# Patient Record
Sex: Female | Born: 1975 | Race: White | Hispanic: No | Marital: Married | Smoking: Never smoker
Health system: Southern US, Community
[De-identification: ages and names within clinical notes are randomized; demographics above are authoritative.]

## PROBLEM LIST (undated history)

## (undated) DIAGNOSIS — E039 Hypothyroidism, unspecified: Secondary | ICD-10-CM

## (undated) DIAGNOSIS — E7800 Pure hypercholesterolemia, unspecified: Secondary | ICD-10-CM

## (undated) DIAGNOSIS — K760 Fatty (change of) liver, not elsewhere classified: Secondary | ICD-10-CM

## (undated) DIAGNOSIS — E669 Obesity, unspecified: Secondary | ICD-10-CM

## (undated) DIAGNOSIS — G629 Polyneuropathy, unspecified: Secondary | ICD-10-CM

## (undated) DIAGNOSIS — I10 Essential (primary) hypertension: Secondary | ICD-10-CM

## (undated) DIAGNOSIS — R51 Headache: Secondary | ICD-10-CM

## (undated) DIAGNOSIS — M109 Gout, unspecified: Secondary | ICD-10-CM

## (undated) DIAGNOSIS — R519 Headache, unspecified: Secondary | ICD-10-CM

## (undated) HISTORY — DX: Pure hypercholesterolemia, unspecified: E78.00

## (undated) HISTORY — DX: Polyneuropathy, unspecified: G62.9

## (undated) HISTORY — DX: Hypothyroidism, unspecified: E03.9

## (undated) HISTORY — DX: Obesity, unspecified: E66.9

## (undated) HISTORY — DX: Essential (primary) hypertension: I10

## (undated) HISTORY — DX: Gout, unspecified: M10.9

## (undated) HISTORY — DX: Headache: R51

## (undated) HISTORY — DX: Fatty (change of) liver, not elsewhere classified: K76.0

## (undated) HISTORY — DX: Headache, unspecified: R51.9

---

## 2006-10-29 ENCOUNTER — Ambulatory Visit: Payer: Self-pay | Admitting: Internal Medicine

## 2006-10-29 LAB — CONVERTED CEMR LAB
BUN: 12 mg/dL (ref 6–23)
Free T4: 1.7 ng/dL (ref 0.9–1.8)
GFR calc non Af Amer: 103 mL/min
Glomerular Filtration Rate, Af Am: 125 mL/min/{1.73_m2}
MCHC: 33.7 g/dL (ref 30.0–36.0)
MCV: 84.5 fL (ref 78.0–100.0)
Potassium: 3.5 meq/L (ref 3.5–5.1)
RBC: 4.84 M/uL (ref 3.87–5.11)
RDW: 12.1 % (ref 11.5–14.6)
Sodium: 137 meq/L (ref 135–145)
T3, Free: 3.7 pg/mL (ref 2.3–4.2)
WBC: 11.2 10*3/uL — ABNORMAL HIGH (ref 4.5–10.5)

## 2008-06-02 ENCOUNTER — Ambulatory Visit: Payer: Self-pay | Admitting: Internal Medicine

## 2008-06-02 DIAGNOSIS — R002 Palpitations: Secondary | ICD-10-CM | POA: Insufficient documentation

## 2008-06-02 DIAGNOSIS — I10 Essential (primary) hypertension: Secondary | ICD-10-CM | POA: Insufficient documentation

## 2008-06-02 DIAGNOSIS — E039 Hypothyroidism, unspecified: Secondary | ICD-10-CM | POA: Insufficient documentation

## 2008-06-03 ENCOUNTER — Telehealth (INDEPENDENT_AMBULATORY_CARE_PROVIDER_SITE_OTHER): Payer: Self-pay | Admitting: *Deleted

## 2008-06-03 ENCOUNTER — Ambulatory Visit: Payer: Self-pay | Admitting: Internal Medicine

## 2008-06-03 LAB — CONVERTED CEMR LAB
Basophils Absolute: 0.1 10*3/uL (ref 0.0–0.1)
Basophils Relative: 0.6 % (ref 0.0–3.0)
Chloride: 106 meq/L (ref 96–112)
Creatinine, Ser: 0.8 mg/dL (ref 0.4–1.2)
Eosinophils Absolute: 0.3 10*3/uL (ref 0.0–0.7)
GFR calc non Af Amer: 87 mL/min
Lymphocytes Relative: 33.6 % (ref 12.0–46.0)
MCHC: 34.6 g/dL (ref 30.0–36.0)
MCV: 86.8 fL (ref 78.0–100.0)
Neutrophils Relative %: 58.1 % (ref 43.0–77.0)
Platelets: 245 10*3/uL (ref 150–400)
RBC: 4.4 M/uL (ref 3.87–5.11)
RDW: 12 % (ref 11.5–14.6)
Sodium: 139 meq/L (ref 135–145)
TSH: 0.74 microintl units/mL (ref 0.35–5.50)

## 2011-11-21 DIAGNOSIS — K760 Fatty (change of) liver, not elsewhere classified: Secondary | ICD-10-CM

## 2011-11-21 HISTORY — DX: Fatty (change of) liver, not elsewhere classified: K76.0

## 2014-03-03 ENCOUNTER — Ambulatory Visit (INDEPENDENT_AMBULATORY_CARE_PROVIDER_SITE_OTHER): Payer: BC Managed Care – PPO | Admitting: Diagnostic Neuroimaging

## 2014-03-03 ENCOUNTER — Encounter (INDEPENDENT_AMBULATORY_CARE_PROVIDER_SITE_OTHER): Payer: Self-pay

## 2014-03-03 ENCOUNTER — Encounter: Payer: Self-pay | Admitting: Diagnostic Neuroimaging

## 2014-03-03 VITALS — BP 159/102 | HR 90 | Temp 98.1°F | Ht 64.0 in | Wt 220.0 lb

## 2014-03-03 DIAGNOSIS — G609 Hereditary and idiopathic neuropathy, unspecified: Secondary | ICD-10-CM

## 2014-03-03 DIAGNOSIS — R200 Anesthesia of skin: Secondary | ICD-10-CM

## 2014-03-03 DIAGNOSIS — R209 Unspecified disturbances of skin sensation: Secondary | ICD-10-CM

## 2014-03-03 MED ORDER — GABAPENTIN 300 MG PO CAPS: 300.0000 mg | ORAL_CAPSULE | Freq: Three times a day (TID) | ORAL | Status: DC

## 2014-03-03 NOTE — Patient Instructions (Signed)
Gabapentin capsules or tablets What is this medicine? GABAPENTIN (GA ba pen tin) is used to control partial seizures in adults with epilepsy. It is also used to treat certain types of nerve pain. This medicine may be used for other purposes; ask your health care provider or pharmacist if you have questions. COMMON BRAND NAME(S): Ascencion DikeGabarone , Neurontin  What should I tell my health care provider before I take this medicine? -pregnant or trying to get pregnant -breast-feeding  How should I use this medicine? Take this medicine by mouth with a glass of water. Follow the directions on the prescription label. You can take it with or without food. If it upsets your stomach, take it with food.Take your medicine at regular intervals. Do not take it more often than directed. Do not stop taking except on your doctor's advice.Overdosage: If you think you have taken too much of this medicine contact a poison control center or emergency room at once. NOTE: This medicine is only for you. Do not share this medicine with others.  What if I miss a dose? If you miss a dose, take it as soon as you can. If it is almost time for your next dose, take only that dose. Do not take double or extra doses.  What may interact with this medicine? Do not take this medicine with any of the following medications: -other gabapentin products  This medicine may also interact with the following medications: -alcohol -antacids -antihistamines for allergy, cough and cold -certain medicines for anxiety or sleep -certain medicines for depression or psychotic disturbances -homatropine; hydrocodone -naproxen -narcotic medicines (opiates) for pain -phenothiazines like chlorpromazine, mesoridazine, prochlorperazine, thioridazine This list may not describe all possible interactions. Give your health care provider a list of all the medicines, herbs, non-prescription drugs, or dietary supplements you use. Also tell them if you smoke,  drink alcohol, or use illegal drugs. Some items may interact with your medicine.  What should I watch for while using this medicine? Visit your doctor or health care professional for regular checks on your progress. You may want to keep a record at home of how you feel your condition is responding to treatment. You may want to share this information with your doctor or health care professional at each visit. You should contact your doctor or health care professional if your seizures get worse or if you have any new types of seizures. Do not stop taking this medicine or any of your seizure medicines unless instructed by your doctor or health care professional. Stopping your medicine suddenly can increase your seizures or their severity. Wear a medical identification bracelet or chain if you are taking this medicine for seizures, and carry a card that lists all your medications. You may get drowsy, dizzy, or have blurred vision. Do not drive, use machinery, or do anything that needs mental alertness until you know how this medicine affects you. To reduce dizzy or fainting spells, do not sit or stand up quickly, especially if you are an older patient. Alcohol can increase drowsiness and dizziness. Avoid alcoholic drinks. Your mouth may get dry. Chewing sugarless gum or sucking hard candy, and drinking plenty of water will help. The use of this medicine may increase the chance of suicidal thoughts or actions. Pay special attention to how you are responding while on this medicine. Any worsening of mood, or thoughts of suicide or dying should be reported to your health care professional right away. Women who become pregnant while using this medicine may  enroll in the [place] American Antiepileptic Drug Pregnancy Registry by calling [phone]. This registry collects information about the safety of antiepileptic drug use during pregnancy.  What side effects may I notice from receiving this medicine? Side  effects that you should report to your doctor or health care professional as soon as possible: -allergic reactions like skin rash, itching or hives, swelling of the face, lips, or tongue -worsening of mood, thoughts or actions of suicide or dying Side effects that usually do not require medical attention (report to your doctor or health care professional if they continue or are bothersome): -constipation -difficulty walking or controlling muscle movements -dizziness -nausea -slurred speech -tiredness -tremors -weight gain This list may not describe all possible side effects. Call your doctor for medical advice about side effects. You may report side effects to FDA at 1-800-FDA-1088. Where should I keep my medicine? Keep out of reach of children. Store at room temperature between 15 and 30 degrees C (59 and 86 degrees F). Throw away any unused medicine after the expiration date. NOTE: This sheet is a summary. It may not cover all possible information. If you have questions about this medicine, talk to your doctor, pharmacist, or health care provider.  2014, Elsevier/Gold Standard. (2013-07-10 09:12:48)

## 2014-03-03 NOTE — Progress Notes (Signed)
[facility]  PATIENT: Tricia Velazquez DOB: 11/20/1975  REFERRING CLINICIAN: Andee Poles Webb HISTORY FROM: patient  REASON FOR VISIT: new consult   HISTORICAL  CHIEF COMPLAINT:  Chief Complaint  Patient presents with  . Numbness    feet, bilateral  . Pain    toes    HISTORY OF PRESENT ILLNESS:   38 year old right-handed female with hypothyroidism, hypertension, here for evaluation of possible neuropathy.  For past 5-6 years patient has had gradual onset, slowly progressive numbness in her toes bilaterally. Now symptoms are worsening. She feels pins and needle sensations in her his toes. Symptoms are worse when she is wearing socks or shoes. Symptoms are worse in the evening. Sometimes the pain shoots and stabs in the feet, causing her to jerk her legs or wake up from sleep.  Occasional numbness in fingers, but not consistent. Patient denies any neck pain, low back pain, radicular pain. No bladder or bowel incontinence. No weakness. No balance or gait difficulty. No headaches, vision changes, slurred speech or trouble swallowing.   REVIEW OF SYSTEMS: Full 14 system review of systems performed and notable only for only as per history of present illness. Otherwise negative.  ALLERGIES: Not on File  HOME MEDICATIONS: No outpatient prescriptions prior to visit.   No facility-administered medications prior to visit.    PAST MEDICAL HISTORY: Past Medical History  Diagnosis Date  . Fatty liver 2013  . Hypertension   . Obesity   . Hypercholesteremia   . Hypothyroidism     PAST SURGICAL HISTORY: Past Surgical History  Procedure Laterality Date  . Cesarean section  1999    FAMILY HISTORY: Family History  Problem Relation Age of Onset  . Diabetes Father   . Heart attack Father   . Heart Problems Maternal Grandfather   . Lung cancer Paternal Grandmother   . Lung cancer Paternal Grandfather   . Heart Problems Paternal Grandfather     SOCIAL  HISTORY:  History   Social History  . Marital Status: Married    Spouse Name: Jethro BastosWilliam Mark Dena    Number of Children: 1  . Years of Education: 12th   Occupational History  .  Other    [employer]   Social History Main Topics  . Smoking status: Never Smoker   . Smokeless tobacco: Never Used  . Alcohol Use: Yes     Comment: occasionally/ very rare  . Drug Use: No  . Sexual Activity: Not on file   Other Topics Concern  . Not on file   Social History Narrative   Patient lives at home with family.   Caffeine Use: 2 cups of soda daily     PHYSICAL EXAM  Filed Vitals:   03/03/14 0956  BP: 159/102  Pulse: 90  Temp: 98.1 F (36.7 C)  TempSrc: Oral  Height: 5\' 4"  (1.626 m)  Weight: 220 lb (99.791 kg)    Not recorded    Body mass index is 37.74 kg/(m^2).  GENERAL EXAM: Patient is in no distress; well developed, nourished and groomed; neck is supple  CARDIOVASCULAR: Regular rate and rhythm, no murmurs, no carotid bruits  NEUROLOGIC: MENTAL STATUS: awake, alert, oriented to person, place and time, recent and remote memory intact, normal attention and concentration, language fluent, comprehension intact, naming intact, fund of knowledge appropriate CRANIAL NERVE: no papilledema on fundoscopic exam, pupils equal and reactive to light, visual fields full to confrontation, extraocular muscles intact, no nystagmus, facial sensation and strength symmetric, hearing  intact, palate elevates symmetrically, uvula midline, shoulder shrug symmetric, tongue midline. MOTOR: normal bulk and tone, full strength in the BUE, BLE SENSORY: normal and symmetric to light touch, temperature, vibration and proprioception; SLIGHTLY DECR PP IN DISTAL FEET BILATERALLY. COORDINATION: finger-nose-finger, fine finger movements normal REFLEXES: deep tendon reflexes present and symmetric; ANKLES 1. DOWN GOING TOES. GAIT/STATION: narrow based gait; able to walk on toes, heels and tandem;  romberg is negative    DIAGNOSTIC DATA (LABS, IMAGING, TESTING) - I reviewed patient records, labs, notes, testing and imaging myself where available.  Lab Results  Component Value Date   WBC 9.7 06/03/2008   HGB 13.2 06/03/2008   HCT 38.2 06/03/2008   MCV 86.8 06/03/2008   PLT 245 06/03/2008      Component Value Date/Time   NA 139 06/03/2008 1301   K 3.5 06/03/2008 1301   CL 106 06/03/2008 1301   CO2 28 06/03/2008 1301   GLUCOSE 108* 06/03/2008 1301   GLUCOSE 87 10/29/2006 1346   BUN 7 06/03/2008 1301   CREATININE 0.8 06/03/2008 1301   CALCIUM 9.0 06/03/2008 1301   GFRNONAA 87 06/03/2008 1301   GFRAA 106 06/03/2008 1301   No results found for this basename: CHOL, HDL, LDLCALC, LDLDIRECT, TRIG, CHOLHDL   No results found for this basename: HGBA1C   No results found for this basename: VITAMINB12   Lab Results  Component Value Date   TSH 0.74 06/03/2008    LABS (02/17/14) B12 512 FOLATE 15.4 TSH 0.48 GLUCOSE 86   ASSESSMENT AND PLAN  38 y.o. year old female here with numbness and tingling in the toes for past 5-6 years. 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 We'll pursue further workup and treat symptomatically with gabapentin.  PLAN: - neuropathy panel - EMG/NCS - gabapentin   Orders Placed This Encounter  Procedures  . Neuropathy Panel  . NCV with EMG(electromyography)   Meds ordered this encounter  Medications  . gabapentin (NEURONTIN) 300 MG capsule    Sig: Take 1 capsule (300 mg total) by mouth 3 (three) times daily.    Dispense:  90 capsule    Refill:  11   Return for EMG/NCS.    Suanne MarkerVIKRAM R. Savina Olshefski, MD 03/03/2014, 11:19 AM Certified in Neurology, Neurophysiology and Neuroimaging  [facility] [address], [address] [place], [place] [address] [phone]

## 2014-03-05 LAB — NEUROPATHY PANEL
A/G Ratio: 1.2 (ref 0.7–2.0)
ALPHA 1: 0.2 g/dL (ref 0.1–0.4)
ALPHA 2: 0.8 g/dL (ref 0.4–1.2)
ANA: NEGATIVE
Albumin ELP: 4 g/dL (ref 3.2–5.6)
Angio Convert Enzyme: 43 U/L (ref 14–82)
BETA: 1.1 g/dL (ref 0.6–1.3)
GLOBULIN, TOTAL: 3.3 g/dL (ref 2.0–4.5)
Gamma Globulin: 1.3 g/dL (ref 0.5–1.6)
Rhuematoid fact SerPl-aCnc: 7.4 IU/mL (ref 0.0–13.9)
Sed Rate: 6 mm/hr (ref 0–32)
TOTAL PROTEIN: 7.3 g/dL (ref 6.0–8.5)
TSH: 0.251 u[IU]/mL — ABNORMAL LOW (ref 0.450–4.500)
VIT D 25 HYDROXY: 22.4 ng/mL — AB (ref 30.0–100.0)
Vitamin B-12: 553 pg/mL (ref 211–946)

## 2014-03-09 ENCOUNTER — Encounter (INDEPENDENT_AMBULATORY_CARE_PROVIDER_SITE_OTHER): Payer: Self-pay

## 2014-03-09 ENCOUNTER — Ambulatory Visit (INDEPENDENT_AMBULATORY_CARE_PROVIDER_SITE_OTHER): Payer: BC Managed Care – PPO | Admitting: Diagnostic Neuroimaging

## 2014-03-09 DIAGNOSIS — R200 Anesthesia of skin: Secondary | ICD-10-CM

## 2014-03-09 DIAGNOSIS — R209 Unspecified disturbances of skin sensation: Secondary | ICD-10-CM

## 2014-03-09 DIAGNOSIS — G609 Hereditary and idiopathic neuropathy, unspecified: Secondary | ICD-10-CM

## 2014-03-09 DIAGNOSIS — Z0289 Encounter for other administrative examinations: Secondary | ICD-10-CM

## 2014-03-09 NOTE — Procedures (Signed)
   [facility]  NCS (NERVE CONDUCTION STUDY) WITH EMG (ELECTROMYOGRAPHY) REPORT   STUDY DATE: 03/09/14 PATIENT NAME: Tricia Velazquez DOB: 05/10/1976 MRN: [id number]  ORDERING CLINICIAN: Joycelyn SchmidVikram Ela Moffat, MD   TECHNOLOGIST: Gearldine ShownLorraine Jones ELECTROMYOGRAPHER: Glenford BayleyVikram R. Abrea Henle, MD  CLINICAL INFORMATION: 38 year old female with bilateral foot numbness and pain.  FINDINGS: NERVE CONDUCTION STUDY: Right peroneal and bilateral tibial motor responses and F-wave latencies are normal. Left peroneal motor response has decreased amplitude and normal conduction velocity. Left peroneal F wave latency is normal. Bilateral H reflex responses are normal. Bilateral peroneal sensory responses are normal. In  NEEDLE ELECTROMYOGRAPHY: Needle exam of lower extremities and lumbar paraspinal muscles is normal. Right vastus medialis, right tibialis anterior, right gastrocnemius, left tibialis anterior, right L4-5 and right L5-S1 paraspinal muscles were assessed.  IMPRESSION:  Mildly abnormal study demonstrate: 1. Mild left peroneal motor response amplitude reduction. Normal nerve conduction studies otherwise. Normal needle EMG. May represent mild left L5 radiculopathy versus mild left peroneal motor neuropathy. 2. No underlying widespread large fiber neuropathy.   INTERPRETING PHYSICIAN:  Tricia MarkerVIKRAM R. Ryane Konieczny, MD Certified in Neurology, Neurophysiology and Neuroimaging  [facility] [address], [address] [place], [place] [address] [phone]

## 2014-11-29 ENCOUNTER — Emergency Department (HOSPITAL_BASED_OUTPATIENT_CLINIC_OR_DEPARTMENT_OTHER)
Admission: EM | Admit: 2014-11-29 | Discharge: 2014-11-29 | Disposition: A | Discharge status: Home / Self Care | Payer: BLUE CROSS/BLUE SHIELD | Attending: Emergency Medicine | Admitting: Emergency Medicine

## 2014-11-29 ENCOUNTER — Emergency Department (HOSPITAL_BASED_OUTPATIENT_CLINIC_OR_DEPARTMENT_OTHER): Payer: BLUE CROSS/BLUE SHIELD

## 2014-11-29 ENCOUNTER — Encounter (HOSPITAL_BASED_OUTPATIENT_CLINIC_OR_DEPARTMENT_OTHER): Payer: Self-pay | Admitting: *Deleted

## 2014-11-29 DIAGNOSIS — Z79899 Other long term (current) drug therapy: Secondary | ICD-10-CM | POA: Diagnosis not present

## 2014-11-29 DIAGNOSIS — J189 Pneumonia, unspecified organism: Secondary | ICD-10-CM

## 2014-11-29 DIAGNOSIS — R000 Tachycardia, unspecified: Secondary | ICD-10-CM | POA: Diagnosis not present

## 2014-11-29 DIAGNOSIS — J159 Unspecified bacterial pneumonia: Secondary | ICD-10-CM | POA: Diagnosis not present

## 2014-11-29 DIAGNOSIS — Z8719 Personal history of other diseases of the digestive system: Secondary | ICD-10-CM | POA: Insufficient documentation

## 2014-11-29 DIAGNOSIS — I10 Essential (primary) hypertension: Secondary | ICD-10-CM | POA: Diagnosis not present

## 2014-11-29 DIAGNOSIS — R05 Cough: Secondary | ICD-10-CM

## 2014-11-29 DIAGNOSIS — E039 Hypothyroidism, unspecified: Secondary | ICD-10-CM | POA: Diagnosis not present

## 2014-11-29 DIAGNOSIS — E669 Obesity, unspecified: Secondary | ICD-10-CM | POA: Diagnosis not present

## 2014-11-29 DIAGNOSIS — R059 Cough, unspecified: Secondary | ICD-10-CM

## 2014-11-29 MED ORDER — LIDOCAINE HCL (PF) 1 % IJ SOLN
INTRAMUSCULAR | Status: AC
  Administered 2014-11-29: 2.1 mL
  Filled 2014-11-29: qty 5

## 2014-11-29 MED ORDER — AZITHROMYCIN 250 MG PO TABS
500.0000 mg | ORAL_TABLET | Freq: Once | ORAL | Status: AC
  Administered 2014-11-29: 500 mg via ORAL
  Filled 2014-11-29: qty 2

## 2014-11-29 MED ORDER — BENZONATATE 100 MG PO CAPS: 100.0000 mg | ORAL_CAPSULE | Freq: Three times a day (TID) | ORAL | Status: DC

## 2014-11-29 MED ORDER — AZITHROMYCIN 250 MG PO TABS: 250.0000 mg | ORAL_TABLET | Freq: Every day | ORAL | Status: DC

## 2014-11-29 MED ORDER — CEFTRIAXONE SODIUM 1 G IJ SOLR
1.0000 g | Freq: Once | INTRAMUSCULAR | Status: AC
  Administered 2014-11-29: 1 g via INTRAMUSCULAR
  Filled 2014-11-29: qty 10

## 2014-11-29 NOTE — Discharge Instructions (Signed)
Please call your doctor for a followup appointment within 24-48 hours. When you talk to your doctor please let them know that you were seen in the emergency department and have them acquire all of your records so that they can discuss the findings with you and formulate a treatment plan to fully care for your new and ongoing problems. ° °

## 2014-11-29 NOTE — ED Notes (Signed)
Patient was seen at PCP last week for URI. Now she states she has developed N/D

## 2014-11-29 NOTE — ED Provider Notes (Signed)
CSN: [id number]     Arrival date & time 11/29/14  1752 History  This chart was scribed for Tricia RollerBrian D Flannery Cavallero, MD by Evon Slackerrance Branch, ED Scribe. This patient was seen in room MH10/MH10 and the patient's care was started at 10:11 PM.      Chief Complaint  Patient presents with  . URI   Patient is a 39 y.o. female presenting with URI. The history is provided by the patient. No language interpreter was used.  URI  HPI Comments: Tricia OgleMelissia S Velazquez is a 39 y.o. female who presents to the Emergency Department complaining of URI symptoms onset 2 days prior. She states she has gradually worsening productive cough and fever onset 2 days ago. Pt states she has also had some diarrhea as well today. Pt states she went to her PCP 2 days ago for HTN and cough and was prescribed cough medicine that hasn't provided any relief. Denies CP.   Past Medical History  Diagnosis Date  . Fatty liver 2013  . Hypertension   . Obesity   . Hypercholesteremia   . Hypothyroidism    Past Surgical History  Procedure Laterality Date  . Cesarean section  1999   Family History  Problem Relation Age of Onset  . Diabetes Father   . Heart attack Father   . Heart Problems Maternal Grandfather   . Lung cancer Paternal Grandmother   . Lung cancer Paternal Grandfather   . Heart Problems Paternal Grandfather    History  Substance Use Topics  . Smoking status: Never Smoker   . Smokeless tobacco: Never Used  . Alcohol Use: Yes     Comment: occasionally/ very rare   OB History    No data available     Review of Systems  All other systems reviewed and are negative.     Allergies  Review of patient's allergies indicates no known allergies.  Home Medications   Prior to Admission medications   Medication Sig Start Date End Date Taking? Authorizing Provider  hydrochlorothiazide (HYDRODIURIL) 12.5 MG tablet Take 12.5 mg by mouth daily.   Yes Historical Provider, MD  azithromycin (ZITHROMAX Z-PAK) 250 MG tablet Take  1 tablet (250 mg total) by mouth daily. 500mg  PO day 1, then 250mg  PO days 205 11/29/14   Tricia RollerBrian D Capers Hagmann, MD  benzonatate (TESSALON) 100 MG capsule Take 1 capsule (100 mg total) by mouth every 8 (eight) hours. 11/29/14   Tricia RollerBrian D Zaeden Lastinger, MD  gabapentin (NEURONTIN) 300 MG capsule Take 1 capsule (300 mg total) by mouth 3 (three) times daily. 03/03/14   Suanne MarkerVikram R Penumalli, MD  levothyroxine (SYNTHROID, LEVOTHROID) 175 MCG tablet Take 175 mcg by mouth daily before breakfast. 1/2 tab on Sunday    Historical Provider, MD  losartan (COZAAR) 50 MG tablet Take 50 mg by mouth daily.    Historical Provider, MD   Triage Vitals: BP 141/88 mmHg  Pulse 112  Temp(Src) 100.6 F (38.1 C) (Oral)  Resp 16  Ht 5\' 4"  (1.626 m)  Wt 225 lb (102.059 kg)  BMI 38.60 kg/m2  SpO2 93%  Physical Exam  Constitutional: She appears well-developed and well-nourished. No distress.  HENT:  Head: Normocephalic and atraumatic.  Mouth/Throat: Oropharynx is clear and moist. No oropharyngeal exudate.  Eyes: Conjunctivae and EOM are normal. Pupils are equal, round, and reactive to light. Right eye exhibits no discharge. Left eye exhibits no discharge. No scleral icterus.  Neck: Normal range of motion. Neck supple. No JVD present. No thyromegaly present.  Cardiovascular: Regular rhythm, normal heart sounds and intact distal pulses.  Tachycardia present.  Exam reveals no gallop and no friction rub.   No murmur heard. Pulmonary/Chest: Effort normal and breath sounds normal. No respiratory distress. She has no wheezes. She has no rales.  Speaks in full sentences no distress.   Abdominal: Soft. Bowel sounds are normal. She exhibits no distension and no mass. There is no tenderness.  Musculoskeletal: Normal range of motion. She exhibits no edema or tenderness.  Lymphadenopathy:    She has no cervical adenopathy.  Neurological: She is alert. Coordination normal.  Skin: Skin is warm and dry. No rash noted. No erythema.  Psychiatric: She  has a normal mood and affect. Her behavior is normal.  Nursing note and vitals reviewed.   ED Course  Procedures (including critical care time) DIAGNOSTIC STUDIES: Oxygen Saturation is 93% on RA, adequate by my interpretation.    COORDINATION OF CARE: 10:22 PM-Discussed treatment plan with pt at bedside and pt agreed to plan.     Labs Review Labs Reviewed - No data to display  Imaging Review Dg Chest 2 View  11/29/2014   CLINICAL DATA:  Cough and congestion.  Nausea.  EXAM: CHEST  2 VIEW  COMPARISON:  None.  FINDINGS: Abnormal airspace opacity in the left mid lung is difficult to correlate on the lateral projection but probably in the lingula. The lungs appear otherwise clear. Cardiac and mediastinal margins appear normal.  IMPRESSION: 1. Lingular airspace opacity favoring pneumonia.   Electronically Signed   By: Herbie Baltimore M.D.   On: 11/29/2014 19:28     MDM   Final diagnoses:  CAP (community acquired pneumonia)   The patient is not in respiratory distress, she has a low-grade fever with a mild tachycardia and normal oxygenation of 98% on room air with no hypotension and no abnormal lung sounds on exam.  I have discussed with the patient the treatment course including antibiotics, Rocephin intramuscular, Zithromax oral, she agrees to follow-up very closely and return should her symptoms worsen. She does appear to have a community-acquired pneumonia. I have personally seen and interpreted the TPA and lateral views of the chest radiographs, this is consistent with a left lung pneumonia. The patient was informed of the results, she is agreeable to the plan.   Meds given in ED:  Medications  cefTRIAXone (ROCEPHIN) injection 1 g (not administered)  azithromycin (ZITHROMAX) tablet 500 mg (not administered)    New Prescriptions   AZITHROMYCIN (ZITHROMAX Z-PAK) 250 MG TABLET    Take 1 tablet (250 mg total) by mouth daily.  PO day 1, then  PO days 205   BENZONATATE  (TESSALON) 100 MG CAPSULE    Take 1 capsule (100 mg total) by mouth every 8 (eight) hours.      I personally performed the services described in this documentation, which was scribed in my presence. The recorded information has been reviewed and is accurate.      Tricia Roller, MD 11/29/14 [phone]

## 2015-06-19 IMAGING — CR DG CHEST 2V
2 series · 2 of 2 positions shown · non-contrast
Comparison: None.

CLINICAL DATA: Cough and congestion.  Nausea.

EXAM:
CHEST  2 VIEW

[w chest pa]
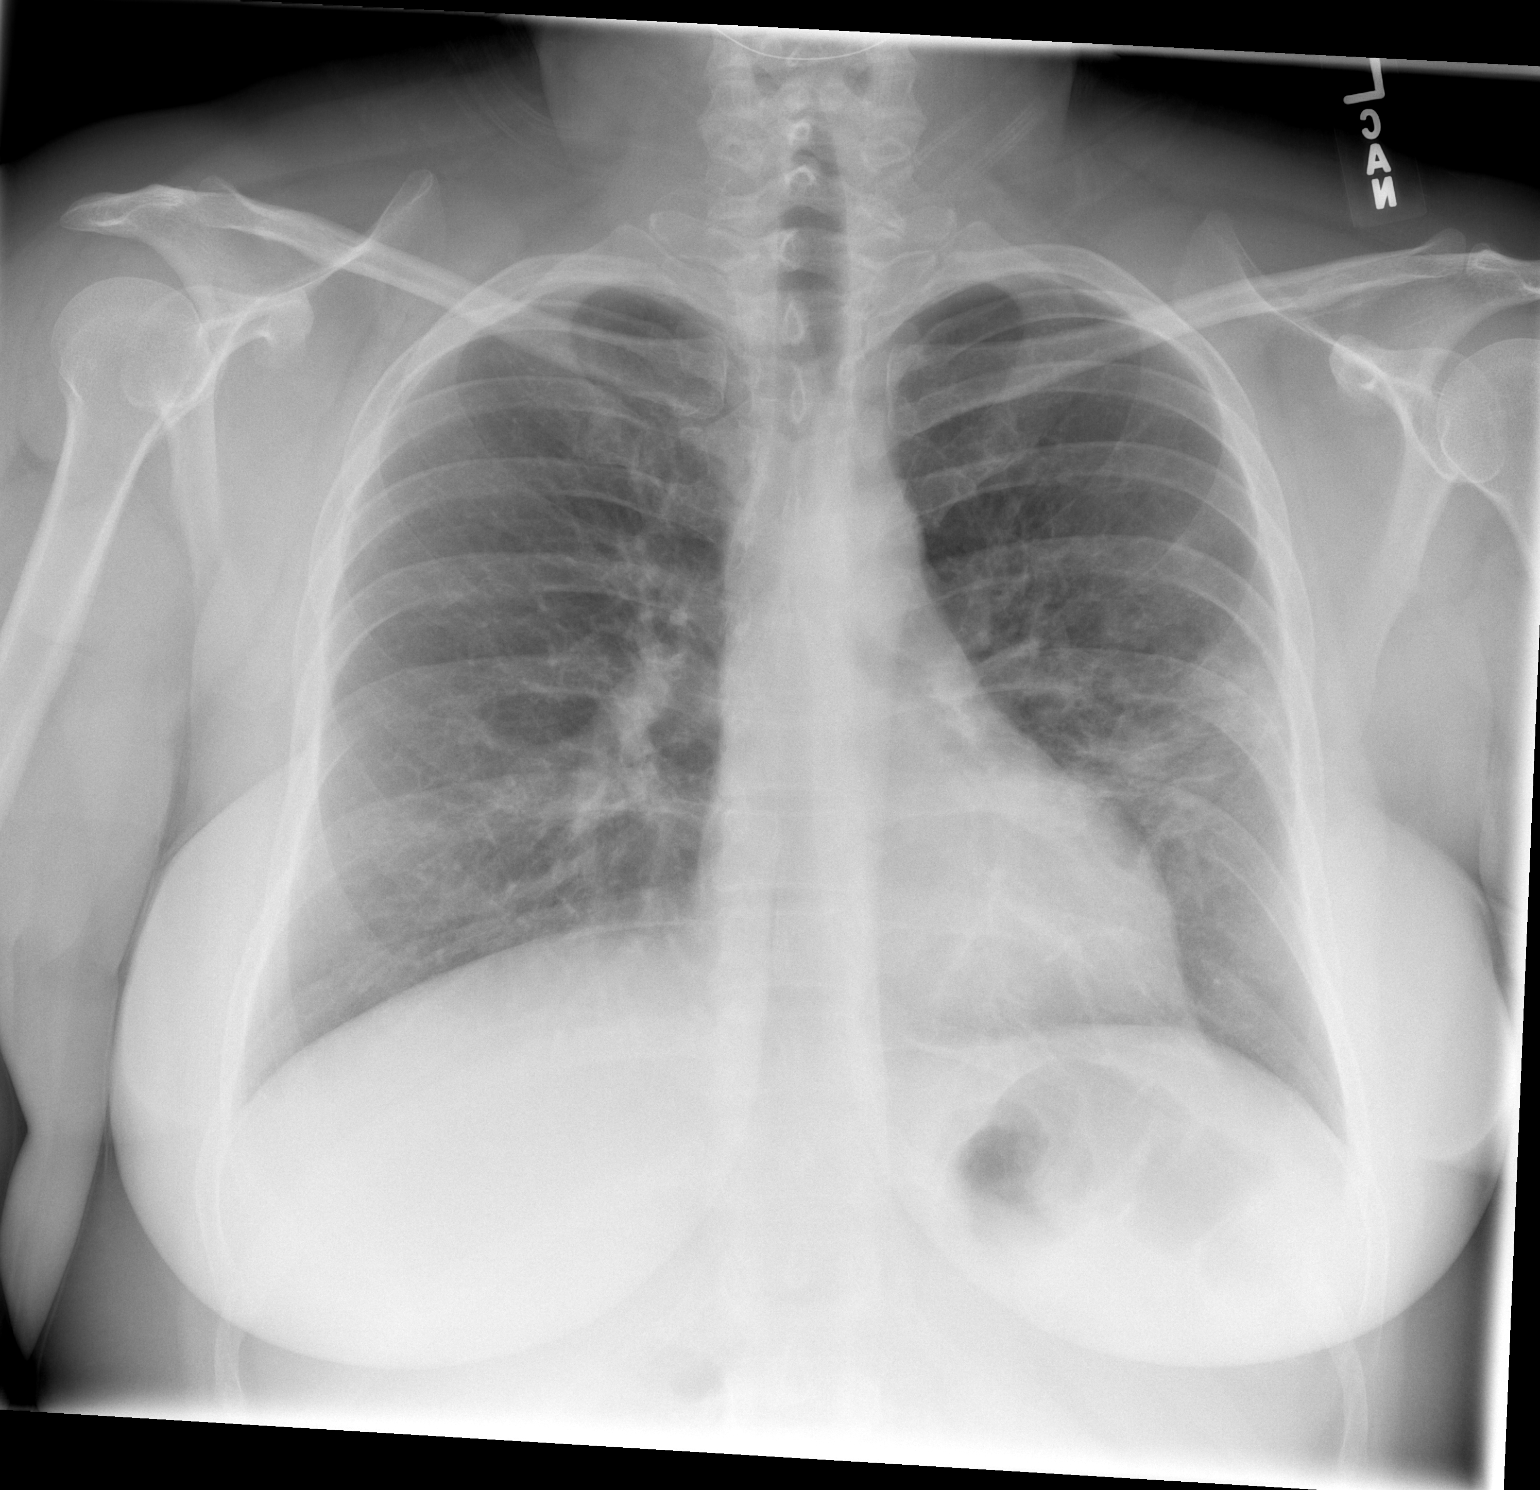

[w chest lat]
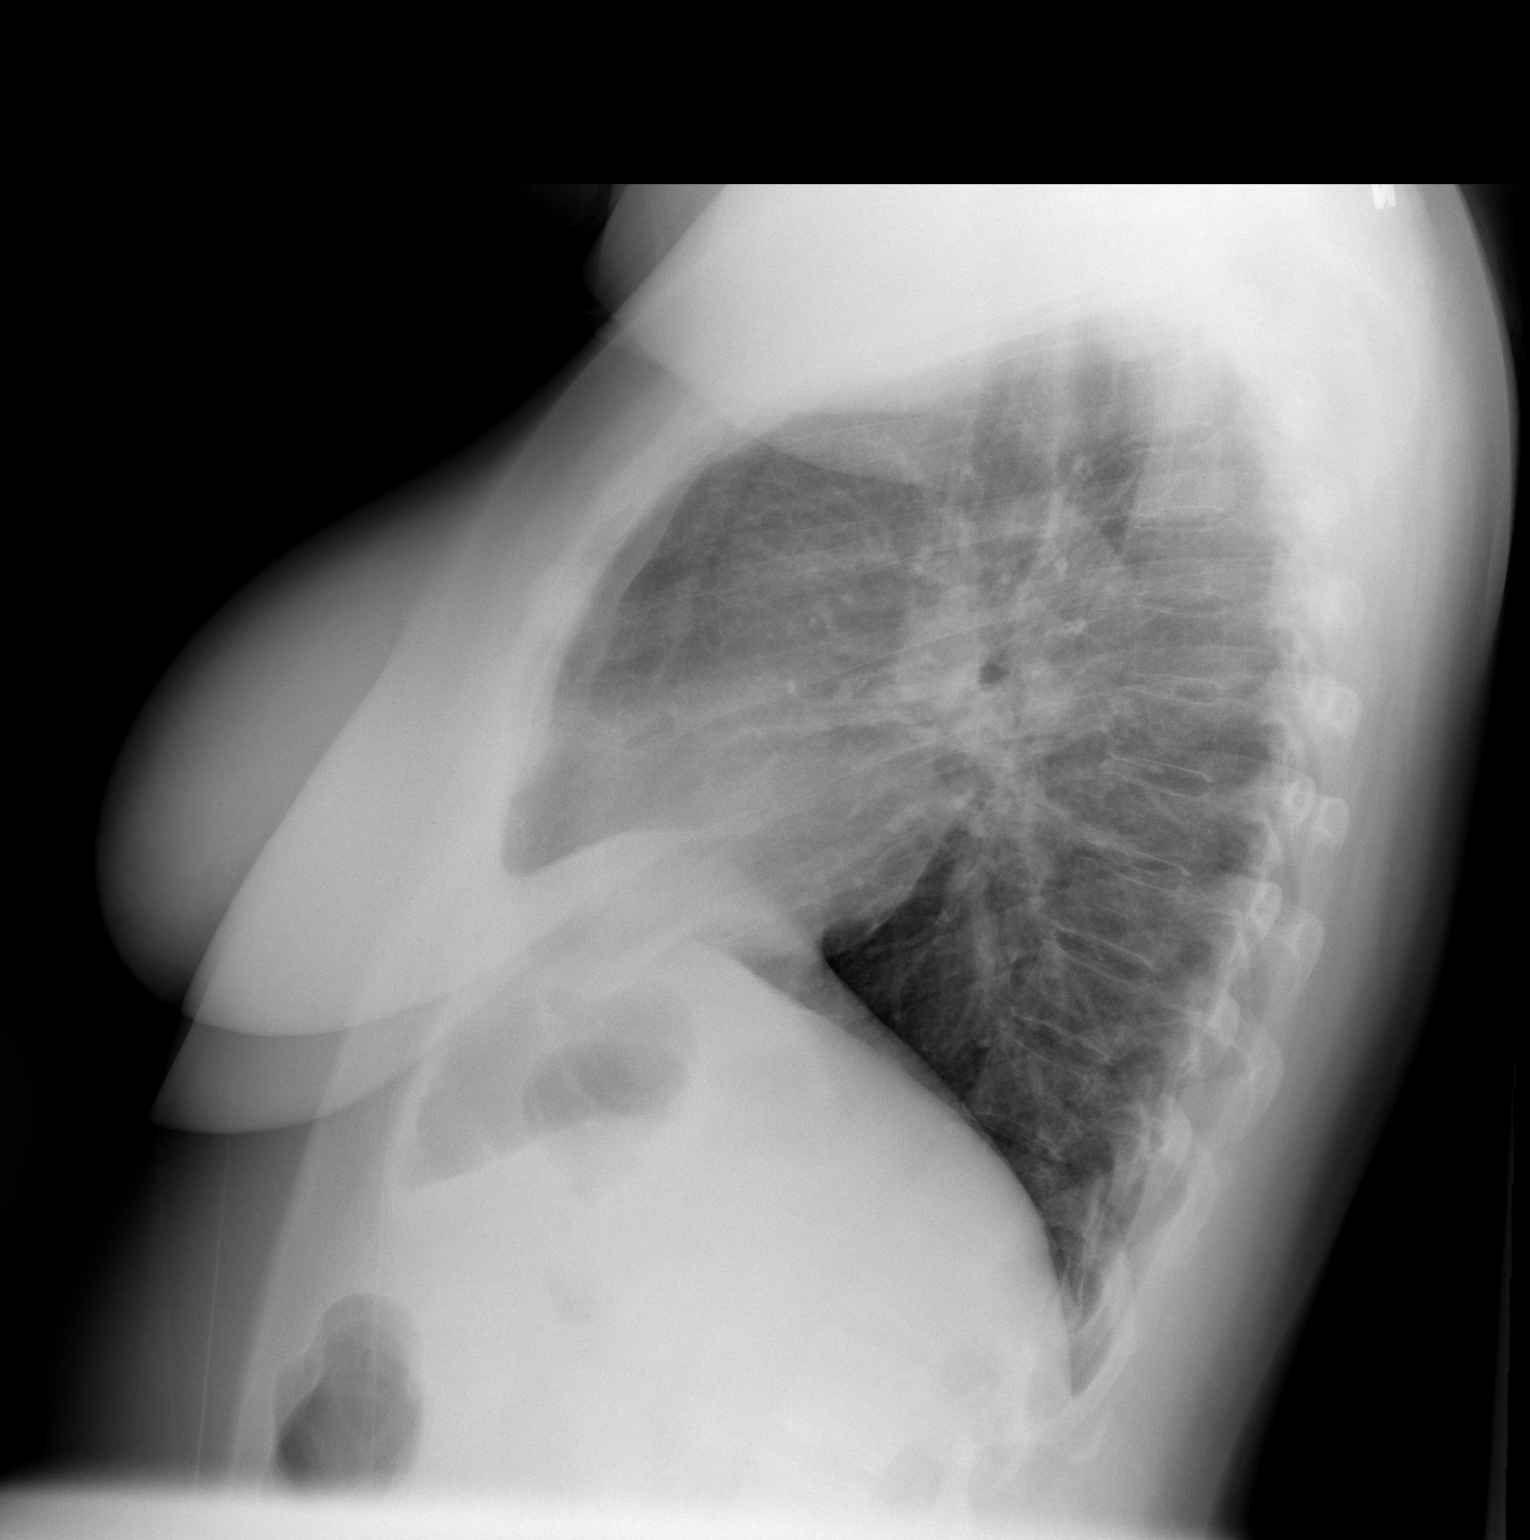

[2 of 2 positions shown; findings below may reference images not displayed]

FINDINGS: Abnormal airspace opacity in the left mid lung is difficult to
correlate on the lateral projection but probably in the lingula. The
lungs appear otherwise clear. Cardiac and mediastinal margins appear
normal.
IMPRESSION: 1. Lingular airspace opacity favoring pneumonia.

## 2016-03-30 DIAGNOSIS — H25043 Posterior subcapsular polar age-related cataract, bilateral: Secondary | ICD-10-CM | POA: Diagnosis not present

## 2016-04-18 DIAGNOSIS — S0502XA Injury of conjunctiva and corneal abrasion without foreign body, left eye, initial encounter: Secondary | ICD-10-CM | POA: Diagnosis not present

## 2016-04-27 DIAGNOSIS — H16143 Punctate keratitis, bilateral: Secondary | ICD-10-CM | POA: Diagnosis not present

## 2016-05-02 DIAGNOSIS — H16143 Punctate keratitis, bilateral: Secondary | ICD-10-CM | POA: Diagnosis not present

## 2016-06-07 DIAGNOSIS — M10071 Idiopathic gout, right ankle and foot: Secondary | ICD-10-CM | POA: Diagnosis not present

## 2016-09-29 DIAGNOSIS — I10 Essential (primary) hypertension: Secondary | ICD-10-CM | POA: Diagnosis not present

## 2016-09-29 DIAGNOSIS — M109 Gout, unspecified: Secondary | ICD-10-CM | POA: Diagnosis not present

## 2016-09-29 DIAGNOSIS — Z13220 Encounter for screening for lipoid disorders: Secondary | ICD-10-CM | POA: Diagnosis not present

## 2016-09-29 DIAGNOSIS — Z23 Encounter for immunization: Secondary | ICD-10-CM | POA: Diagnosis not present

## 2016-09-29 DIAGNOSIS — E039 Hypothyroidism, unspecified: Secondary | ICD-10-CM | POA: Diagnosis not present

## 2016-09-29 DIAGNOSIS — Z0000 Encounter for general adult medical examination without abnormal findings: Secondary | ICD-10-CM | POA: Diagnosis not present

## 2016-10-20 DIAGNOSIS — S300XXA Contusion of lower back and pelvis, initial encounter: Secondary | ICD-10-CM | POA: Diagnosis not present

## 2016-12-14 DIAGNOSIS — M533 Sacrococcygeal disorders, not elsewhere classified: Secondary | ICD-10-CM | POA: Diagnosis not present

## 2016-12-15 ENCOUNTER — Other Ambulatory Visit: Payer: Self-pay | Admitting: Family Medicine

## 2016-12-15 ENCOUNTER — Ambulatory Visit
Admission: RE | Admit: 2016-12-15 | Discharge: 2016-12-15 | Disposition: A | Discharge status: Home / Self Care | Payer: BLUE CROSS/BLUE SHIELD | Source: Ambulatory Visit | Attending: Family Medicine | Admitting: Family Medicine

## 2016-12-15 DIAGNOSIS — M533 Sacrococcygeal disorders, not elsewhere classified: Secondary | ICD-10-CM

## 2017-01-17 DIAGNOSIS — I10 Essential (primary) hypertension: Secondary | ICD-10-CM | POA: Diagnosis not present

## 2017-01-17 DIAGNOSIS — L237 Allergic contact dermatitis due to plants, except food: Secondary | ICD-10-CM | POA: Diagnosis not present

## 2017-02-23 DIAGNOSIS — S91209A Unspecified open wound of unspecified toe(s) with damage to nail, initial encounter: Secondary | ICD-10-CM | POA: Diagnosis not present

## 2017-02-23 DIAGNOSIS — G629 Polyneuropathy, unspecified: Secondary | ICD-10-CM | POA: Diagnosis not present

## 2017-06-05 DIAGNOSIS — I10 Essential (primary) hypertension: Secondary | ICD-10-CM | POA: Diagnosis not present

## 2017-06-05 DIAGNOSIS — Z01419 Encounter for gynecological examination (general) (routine) without abnormal findings: Secondary | ICD-10-CM | POA: Diagnosis not present

## 2017-06-05 DIAGNOSIS — Z1151 Encounter for screening for human papillomavirus (HPV): Secondary | ICD-10-CM | POA: Diagnosis not present

## 2017-06-05 DIAGNOSIS — N920 Excessive and frequent menstruation with regular cycle: Secondary | ICD-10-CM | POA: Diagnosis not present

## 2017-07-05 IMAGING — CR DG SACRUM/COCCYX 2+V
3 series · 3 of 3 positions shown · non-contrast
Comparison: None.

CLINICAL DATA: Pt fell down steps 8 weeks ago continued coccyx pain
since difficulty getting out of chairs

EXAM:
SACRUM AND COCCYX - 2+ VIEW

[t sacrum ap]
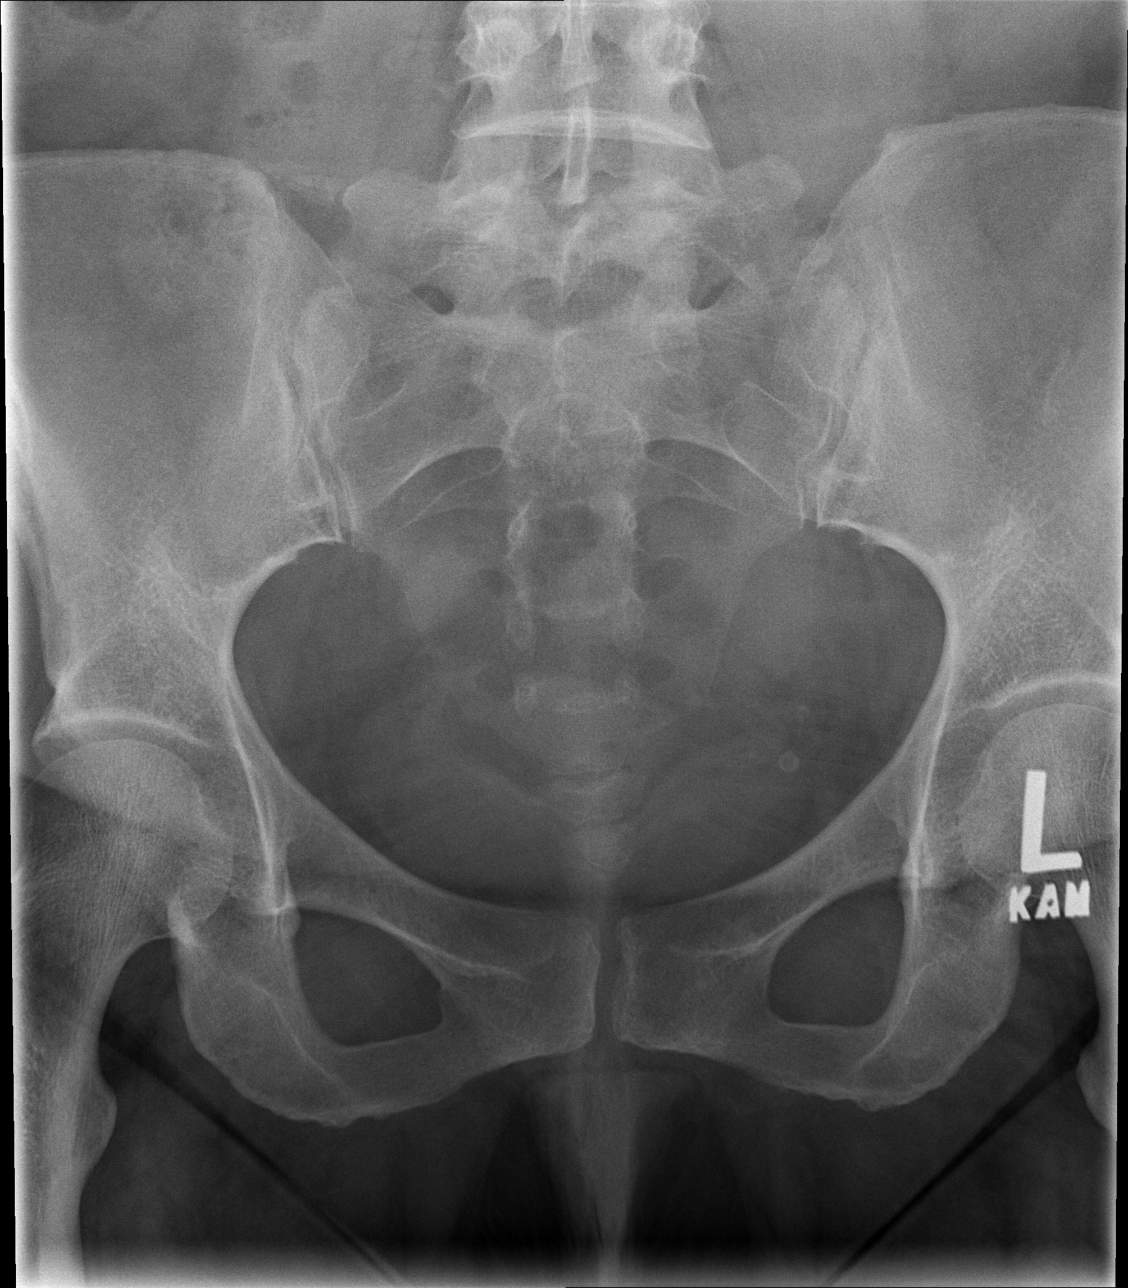

[t coccyx ap]
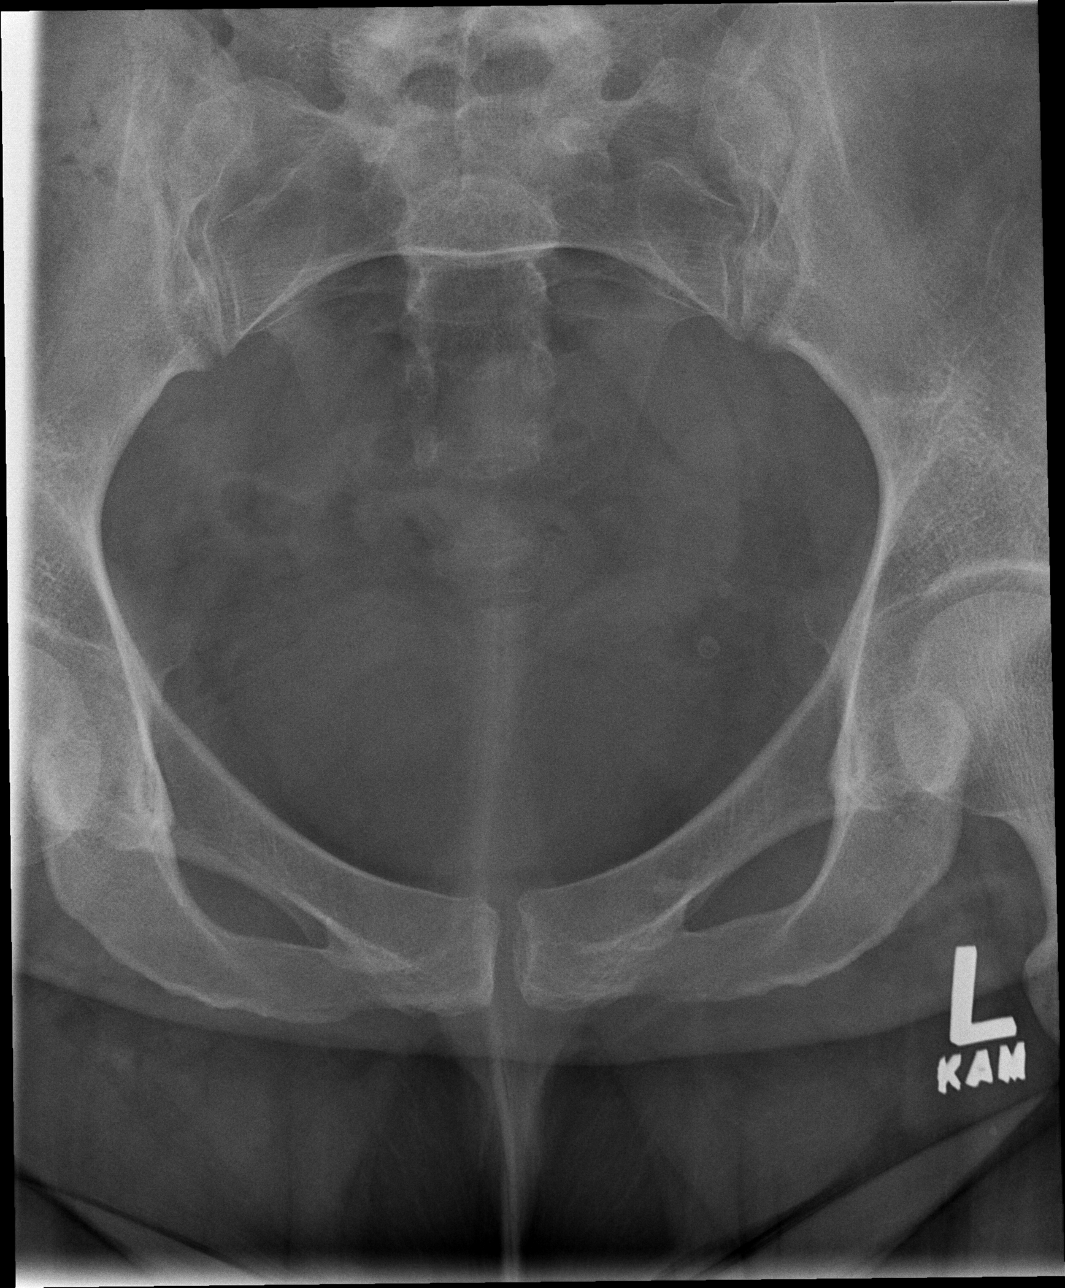

[t sacrum coccyx lat]
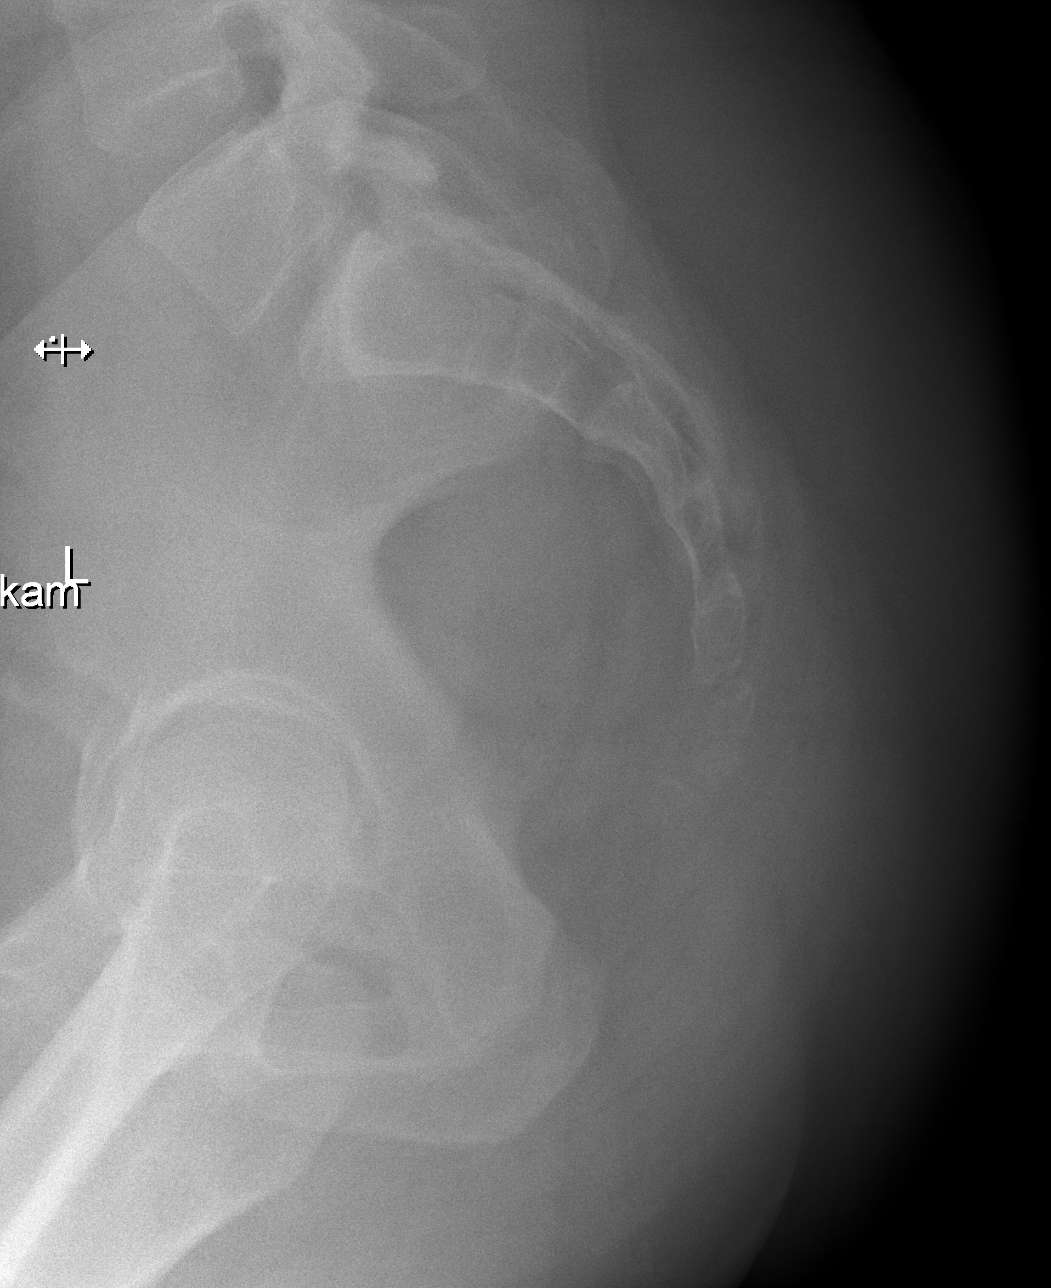

[3 of 3 positions shown; findings below may reference images not displayed]

FINDINGS: There is no evidence of fracture or other focal bone lesions.
IMPRESSION: Negative.

## 2017-07-19 DIAGNOSIS — Z23 Encounter for immunization: Secondary | ICD-10-CM | POA: Diagnosis not present

## 2017-07-19 DIAGNOSIS — E039 Hypothyroidism, unspecified: Secondary | ICD-10-CM | POA: Diagnosis not present

## 2017-07-19 DIAGNOSIS — R5383 Other fatigue: Secondary | ICD-10-CM | POA: Diagnosis not present

## 2017-07-19 DIAGNOSIS — R7989 Other specified abnormal findings of blood chemistry: Secondary | ICD-10-CM | POA: Diagnosis not present

## 2017-09-05 DIAGNOSIS — E039 Hypothyroidism, unspecified: Secondary | ICD-10-CM | POA: Diagnosis not present

## 2017-10-02 DIAGNOSIS — Z13220 Encounter for screening for lipoid disorders: Secondary | ICD-10-CM | POA: Diagnosis not present

## 2017-10-02 DIAGNOSIS — Z0000 Encounter for general adult medical examination without abnormal findings: Secondary | ICD-10-CM | POA: Diagnosis not present

## 2017-10-02 DIAGNOSIS — R202 Paresthesia of skin: Secondary | ICD-10-CM | POA: Diagnosis not present

## 2017-10-10 DIAGNOSIS — G4489 Other headache syndrome: Secondary | ICD-10-CM | POA: Diagnosis not present

## 2017-10-10 DIAGNOSIS — I10 Essential (primary) hypertension: Secondary | ICD-10-CM | POA: Diagnosis not present

## 2017-11-21 DIAGNOSIS — E039 Hypothyroidism, unspecified: Secondary | ICD-10-CM | POA: Diagnosis not present

## 2017-12-12 DIAGNOSIS — J0100 Acute maxillary sinusitis, unspecified: Secondary | ICD-10-CM | POA: Diagnosis not present

## 2017-12-12 DIAGNOSIS — J069 Acute upper respiratory infection, unspecified: Secondary | ICD-10-CM | POA: Diagnosis not present

## 2018-01-11 DIAGNOSIS — H43811 Vitreous degeneration, right eye: Secondary | ICD-10-CM | POA: Diagnosis not present

## 2018-02-19 DIAGNOSIS — H43813 Vitreous degeneration, bilateral: Secondary | ICD-10-CM | POA: Diagnosis not present

## 2018-03-05 DIAGNOSIS — I10 Essential (primary) hypertension: Secondary | ICD-10-CM | POA: Diagnosis not present

## 2018-03-05 DIAGNOSIS — M79671 Pain in right foot: Secondary | ICD-10-CM | POA: Diagnosis not present

## 2018-03-20 ENCOUNTER — Other Ambulatory Visit: Payer: Self-pay | Admitting: Podiatry

## 2018-03-20 ENCOUNTER — Ambulatory Visit (INDEPENDENT_AMBULATORY_CARE_PROVIDER_SITE_OTHER): Payer: BLUE CROSS/BLUE SHIELD

## 2018-03-20 ENCOUNTER — Encounter: Payer: Self-pay | Admitting: Podiatry

## 2018-03-20 ENCOUNTER — Encounter: Payer: Self-pay | Admitting: *Deleted

## 2018-03-20 ENCOUNTER — Other Ambulatory Visit: Payer: Self-pay | Admitting: *Deleted

## 2018-03-20 ENCOUNTER — Ambulatory Visit: Payer: BLUE CROSS/BLUE SHIELD | Admitting: Podiatry

## 2018-03-20 VITALS — BP 139/85 | HR 79 | Resp 16 | Ht 64.0 in | Wt 240.0 lb

## 2018-03-20 DIAGNOSIS — M779 Enthesopathy, unspecified: Secondary | ICD-10-CM

## 2018-03-20 DIAGNOSIS — M79672 Pain in left foot: Secondary | ICD-10-CM

## 2018-03-20 DIAGNOSIS — M79671 Pain in right foot: Secondary | ICD-10-CM

## 2018-03-20 DIAGNOSIS — G629 Polyneuropathy, unspecified: Secondary | ICD-10-CM

## 2018-03-20 MED ORDER — PREGABALIN 25 MG PO CAPS: 25.0000 mg | ORAL_CAPSULE | Freq: Two times a day (BID) | ORAL | 2 refills | Status: DC

## 2018-03-20 NOTE — Progress Notes (Signed)
   Subjective:    Patient ID: Tricia Velazquez, female    DOB: 1976-01-29, 42 y.o.   MRN: [id number]  HPI    Review of Systems  All other systems reviewed and are negative.      Objective:   Physical Exam        Assessment & Plan:

## 2018-03-20 NOTE — Progress Notes (Signed)
Subjective:   Patient ID: Tricia Velazquez, female   DOB: 42 y.o.   MRN: [id number]   HPI Patient presents stating that she has had a 5-year history of burning tingling of her feet that gradually become more symptomatic.  She is had nerve conduction studies which were negative and has had blood work which was negative.  Patient states the pain is present all day and worse at night and was not able to tolerate gabapentin.  Patient does not smoke and likes to be active  Review of Systems  All other systems reviewed and are negative.       Objective:  Physical Exam  Constitutional: She appears well-developed and well-nourished.  Cardiovascular: Intact distal pulses.  Pulmonary/Chest: Effort normal.  Musculoskeletal: Normal range of motion.  Neurological: She is alert.  Skin: Skin is warm.  Nursing note and vitals reviewed.   Vascular status was found to be normal and I did note profound diminishment of sharp dull vibratory bilateral.  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  We are going to try her on low-dose Lyrica and see if she can tolerate that even though she may not be able to with her history of gabapentin.  I then went ahead and I discussed nerve biopsies to try to understand if there is a small fiber versus large fiber disease and she is scheduled to have this done next week.  Patient will be seen back and I educated her on this  X-rays indicate no indications of systemic arthritis or other bone condition

## 2018-03-28 ENCOUNTER — Encounter: Payer: Self-pay | Admitting: Podiatry

## 2018-03-28 ENCOUNTER — Ambulatory Visit (INDEPENDENT_AMBULATORY_CARE_PROVIDER_SITE_OTHER): Payer: BLUE CROSS/BLUE SHIELD | Admitting: Podiatry

## 2018-03-28 DIAGNOSIS — G609 Hereditary and idiopathic neuropathy, unspecified: Secondary | ICD-10-CM | POA: Diagnosis not present

## 2018-03-28 DIAGNOSIS — G629 Polyneuropathy, unspecified: Secondary | ICD-10-CM | POA: Diagnosis not present

## 2018-03-28 NOTE — Progress Notes (Signed)
Subjective:   Patient ID: Tricia Velazquez, female   DOB: 41 y.o.   MRN: [id number]   HPI Patient presents with progressive neuropathy that is becoming more painful for her and debilitated stating she started Lyrica and it seems to be making a slight difference but it is making her tired.  She was unable to take gabapentin.  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  This was all done under sterile technique with sterile clubbing and sterile dressings were were applied to the area afterwards and patient will start soaks in 1 day and was encouraged to call if any issues should occur and will be seen back 4 weeks to go over results of nerve biopsy I also did discuss the possibility for neurogenic's       

## 2018-04-08 ENCOUNTER — Encounter: Payer: Self-pay | Admitting: Podiatry

## 2018-04-29 ENCOUNTER — Encounter: Payer: Self-pay | Admitting: Podiatry

## 2018-04-29 ENCOUNTER — Ambulatory Visit: Payer: BLUE CROSS/BLUE SHIELD | Admitting: Podiatry

## 2018-04-29 DIAGNOSIS — G629 Polyneuropathy, unspecified: Secondary | ICD-10-CM | POA: Diagnosis not present

## 2018-04-29 DIAGNOSIS — M779 Enthesopathy, unspecified: Secondary | ICD-10-CM | POA: Diagnosis not present

## 2018-04-29 MED ORDER — DICLOFENAC SODIUM 75 MG PO TBEC: 75.0000 mg | DELAYED_RELEASE_TABLET | Freq: Two times a day (BID) | ORAL | 2 refills | Status: DC

## 2018-04-29 NOTE — Progress Notes (Signed)
Subjective:   Patient ID: Tricia BouillonMelissa S Velazquez, female   DOB: 42 y.o.   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  Patient also will continue Lyrica and may consider other medications and I have advised on trying to increase the dose with her family physician

## 2018-04-30 NOTE — Addendum Note (Signed)
Addended by: Alphia Kava'CONNELL, VALERY D on: 04/30/2018 05:20 PM   Modules accepted: Orders

## 2018-07-01 ENCOUNTER — Ambulatory Visit: Payer: BLUE CROSS/BLUE SHIELD | Admitting: Podiatry

## 2018-08-14 DIAGNOSIS — J301 Allergic rhinitis due to pollen: Secondary | ICD-10-CM | POA: Diagnosis not present

## 2018-08-14 DIAGNOSIS — J208 Acute bronchitis due to other specified organisms: Secondary | ICD-10-CM | POA: Diagnosis not present

## 2018-08-14 DIAGNOSIS — R51 Headache: Secondary | ICD-10-CM | POA: Diagnosis not present

## 2018-10-21 DIAGNOSIS — Z23 Encounter for immunization: Secondary | ICD-10-CM | POA: Diagnosis not present

## 2018-10-21 DIAGNOSIS — E039 Hypothyroidism, unspecified: Secondary | ICD-10-CM | POA: Diagnosis not present

## 2018-10-21 DIAGNOSIS — Z0000 Encounter for general adult medical examination without abnormal findings: Secondary | ICD-10-CM | POA: Diagnosis not present

## 2018-10-21 DIAGNOSIS — Z131 Encounter for screening for diabetes mellitus: Secondary | ICD-10-CM | POA: Diagnosis not present

## 2018-10-21 DIAGNOSIS — I10 Essential (primary) hypertension: Secondary | ICD-10-CM | POA: Diagnosis not present

## 2018-10-21 DIAGNOSIS — Z136 Encounter for screening for cardiovascular disorders: Secondary | ICD-10-CM | POA: Diagnosis not present

## 2018-12-27 ENCOUNTER — Institutional Professional Consult (permissible substitution): Payer: BLUE CROSS/BLUE SHIELD | Admitting: Diagnostic Neuroimaging

## 2019-01-20 ENCOUNTER — Encounter: Payer: Self-pay | Admitting: *Deleted

## 2019-01-21 ENCOUNTER — Encounter: Payer: Self-pay | Admitting: Diagnostic Neuroimaging

## 2019-01-21 ENCOUNTER — Ambulatory Visit: Payer: BLUE CROSS/BLUE SHIELD | Admitting: Diagnostic Neuroimaging

## 2019-01-21 DIAGNOSIS — R519 Headache, unspecified: Secondary | ICD-10-CM

## 2019-01-21 DIAGNOSIS — R51 Headache: Secondary | ICD-10-CM | POA: Diagnosis not present

## 2019-01-21 NOTE — Progress Notes (Signed)
[facility]  PATIENT: Tricia Velazquez DOB: 1976-04-10  REFERRING CLINICIAN: A Morrow HISTORY FROM: patient  REASON FOR VISIT: new consult    HISTORICAL  CHIEF COMPLAINT:  Chief Complaint  Patient presents with  . Headache    rm 6, New Pt, "having headaches on right side of head x 1 yr; when I move or bend over it will start pounding- not when I stay still; right eye area swells with headaches; I have 3 a yr lasting 2 weeks each"    HISTORY OF PRESENT ILLNESS:   43 year old female here for evaluation of headaches.    Patient has had 2 with migraine headaches in her life, when she was younger.  These were global throbbing headaches associated with nausea and sensitive to light and sound.  She took some Imitrex which seemed to help.  Now having new headaches since November 2018 consisting of right scalp pain, right eye and face swelling, lasting up to 2 weeks at a time.  She has some throbbing sensation with this headache.  No nausea or vomiting.  No sensitive to light or sound.  Headaches can last 2 weeks at a time.  She is having headaches every 1 to 2 months.  No neck pain or dizziness.  No visual changes, aura, scotoma.  Patient has had some weight gain over the past few years.  She has had eye exams regularly without abnormalities.    REVIEW OF SYSTEMS: Full 14 system review of systems performed and negative with exception of: Headache snoring aching muscles.  ALLERGIES: Allergies  Allergen Reactions  . Codeine     Crazy dreams  . Gabapentin     Pt stated, "It makes me feel like a zombie; makes me tired all day"    HOME MEDICATIONS: Outpatient Medications Prior to Visit  Medication Sig Dispense Refill  . diclofenac (VOLTAREN) 75 MG EC tablet Take 1 tablet (75 mg total) by mouth 2 (two) times daily. 50 tablet 2  . ibuprofen (ADVIL,MOTRIN) 200 MG tablet Take 200 mg by mouth every 6 (six) hours as needed.    Marland Kitchen losartan (COZAAR) 50 MG tablet Take 50 mg  by mouth daily.    Marland Kitchen SYNTHROID 150 MCG tablet TAKE 1 TABLET BY MOUTH EVERY DAY ON EMPTY STOMACH IN THE MORNING  1  . azithromycin (ZITHROMAX Z-PAK) 250 MG tablet Take 1 tablet (250 mg total) by mouth daily. 500mg  PO day 1, then 250mg  PO days 205 6 tablet 0  . benzonatate (TESSALON) 100 MG capsule Take 1 capsule (100 mg total) by mouth every 8 (eight) hours. 21 capsule 0  . gabapentin (NEURONTIN) 300 MG capsule Take 1 capsule (300 mg total) by mouth 3 (three) times daily. 90 capsule 11  . hydrochlorothiazide (HYDRODIURIL) 12.5 MG tablet Take 12.5 mg by mouth daily.    Marland Kitchen ipratropium (ATROVENT) 0.06 % nasal spray SPRAY 2 SPRAYS INTO BOTH NOSTRILS 4 TIMES DAILY  0  . levothyroxine (SYNTHROID, LEVOTHROID) 175 MCG tablet Take 150 mcg by mouth daily.     . Norethindrone-Ethinyl Estradiol-Fe Biphas (LO LOESTRIN FE) 1 MG-10 MCG / 10 MCG tablet TAKE 1 TABLET BY MOUTH EVERY DAY    . pregabalin (LYRICA) 25 MG capsule Take 1 capsule (25 mg total) by mouth 2 (two) times daily. 60 capsule 2   No facility-administered medications prior to visit.     PAST MEDICAL HISTORY: Past Medical History:  Diagnosis Date  . Fatty liver 2013  . Gout   .  Headache   . Hypercholesteremia   . Hypertension   . Hypothyroidism   . Obesity   . Peripheral neuropathy     PAST SURGICAL HISTORY: Past Surgical History:  Procedure Laterality Date  . CESAREAN SECTION  1999    FAMILY HISTORY: Family History  Problem Relation Age of Onset  . Diabetes Father   . Heart attack Father   . Heart Problems Maternal Grandfather   . Lung cancer Paternal Grandmother   . Lung cancer Paternal Grandfather   . Heart Problems Paternal Grandfather     SOCIAL HISTORY: Social History   Socioeconomic History  . Marital status: Married    Spouse name: Doloros Igoe  . Number of children: 1  . Years of education: 12th  . Highest education level: Not on file  Occupational History    Employer: OTHER    Comment: [employer]  Social Needs  . Financial resource strain: Not on file  . Food insecurity:    Worry: Not on file    Inability: Not on file  . Transportation needs:    Medical: Not on file    Non-medical: Not on file  Tobacco Use  . Smoking status: Never Smoker  . Smokeless tobacco: Never Used  Substance and Sexual Activity  . Alcohol use: Yes    Comment: occasionally/ very rare  . Drug use: No  . Sexual activity: Not on file  Lifestyle  . Physical activity:    Days per week: Not on file    Minutes per session: Not on file  . Stress: Not on file  Relationships  . Social connections:    Talks on phone: Not on file    Gets together: Not on file    Attends religious service: Not on file    Active member of club or organization: Not on file    Attends meetings of clubs or organizations: Not on file    Relationship status: Not on file  . Intimate partner violence:    Fear of current or ex partner: Not on file    Emotionally abused: Not on file    Physically abused: Not on file    Forced sexual activity: Not on file  Other Topics Concern  . Not on file  Social History Narrative   Patient lives at home with family.   Caffeine Use: 2 cups of soda daily     PHYSICAL EXAM  GENERAL EXAM/CONSTITUTIONAL: Vitals:  Vitals:   01/21/19 1533  BP: (!) 148/95  Pulse: 83  Weight: 240 lb 12.8 oz (109.2 kg)  Height: 5\' 4"  (1.626 m)     Body mass index is 41.33 kg/m. Wt Readings from Last 3 Encounters:  01/21/19 240 lb 12.8 oz (109.2 kg)  03/20/18 240 lb (108.9 kg)  11/29/14 225 lb (102.1 kg)     Patient is in no distress; well developed, nourished and groomed; neck is supple  CARDIOVASCULAR:  Examination of carotid arteries is normal; no carotid bruits  Regular rate and rhythm, no murmurs  Examination of peripheral vascular system by observation and palpation is normal  EYES:  Ophthalmoscopic exam of optic discs and posterior segments is normal; no papilledema or  hemorrhages  Visual Acuity Screening   Right eye Left eye Both eyes  Without correction:     With correction: 20/30 20/40   Comments: Wears contacts    MUSCULOSKELETAL:  Gait, strength, tone, movements noted in Neurologic exam below  NEUROLOGIC: MENTAL STATUS:  No flowsheet data  found.  awake, alert, oriented to person, place and time  recent and remote memory intact  normal attention and concentration  language fluent, comprehension intact, naming intact  fund of knowledge appropriate  CRANIAL NERVE:   2nd - no papilledema on fundoscopic exam  2nd, 3rd, 4th, 6th - pupils equal and reactive to light, visual fields full to confrontation, extraocular muscles intact, no nystagmus  5th - facial sensation symmetric  7th - facial strength symmetric  8th - hearing intact  9th - palate elevates symmetrically, uvula midline  11th - shoulder shrug symmetric  12th - tongue protrusion midline  MOTOR:   normal bulk and tone, full strength in the BUE, BLE  SENSORY:   normal and symmetric to light touch, temperature, vibration; EXCEPT DECR TEMP IN FEET  COORDINATION:   finger-nose-finger, fine finger movements normal  REFLEXES:   deep tendon reflexes TRACE and symmetric  GAIT/STATION:   narrow based gait     DIAGNOSTIC DATA (LABS, IMAGING, TESTING) - I reviewed patient records, labs, notes, testing and imaging myself where available.  Lab Results  Component Value Date   WBC 9.7 06/03/2008   HGB 13.2 06/03/2008   HCT 38.2 06/03/2008   MCV 86.8 06/03/2008   PLT 245 06/03/2008      Component Value Date/Time   NA 139 06/03/2008 1301   K 3.5 06/03/2008 1301   CL 106 06/03/2008 1301   CO2 28 06/03/2008 1301   GLUCOSE 108 (H) 06/03/2008 1301   GLUCOSE 87 10/29/2006 1346   BUN 7 06/03/2008 1301   CREATININE 0.8 06/03/2008 1301   CALCIUM 9.0 06/03/2008 1301   PROT 7.3 03/03/2014 1126   GFRNONAA 87 06/03/2008 1301   GFRAA 106 06/03/2008 1301   No  results found for: CHOL, HDL, LDLCALC, LDLDIRECT, TRIG, CHOLHDL No results found for: [id number] Lab Results  Component Value Date   VITAMINB12 553 03/03/2014   Lab Results  Component Value Date   TSH 0.251 (L) 03/03/2014         ASSESSMENT AND PLAN  43 y.o. year old female here with new onset headaches (Nov 2018).    Ddx: migraine variant vs idiopathic intracranial hypertension (pseudotumor cerebri)   1. Nonintractable episodic headache, unspecified headache type     PLAN:  POSITIONAL HEADACHE (worse with bending or laying down) - check MRI brain w/wo - then may consider lumbar puncture to measure opening pressure - consider sleep study - consider topiramate and rizatriptan in future - continue ibuprofen or tylenol as needed  Orders Placed This Encounter  Procedures  . MR BRAIN W WO CONTRAST   Return in about 6 months (around 07/24/2019).    Suanne Marker, MD 01/21/2019, 3:47 PM Certified in Neurology, Neurophysiology and Neuroimaging  [facility] [address] [place], [place] [address] [phone]

## 2019-01-23 ENCOUNTER — Telehealth: Payer: Self-pay | Admitting: Diagnostic Neuroimaging

## 2019-01-23 NOTE — Telephone Encounter (Signed)
Pt has returned call to Emily, she is asking for a call back °

## 2019-01-23 NOTE — Telephone Encounter (Signed)
lvm for pt to call back about schduling mri  BCBS Auth: [id number] (exp. 01/23/19 to 03/23/19)

## 2019-01-23 NOTE — Telephone Encounter (Signed)
I spoke to the patient she is scheduled at [facility] for 01/28/19

## 2019-01-28 ENCOUNTER — Ambulatory Visit: Payer: BLUE CROSS/BLUE SHIELD

## 2019-01-28 DIAGNOSIS — R519 Headache, unspecified: Secondary | ICD-10-CM

## 2019-01-28 DIAGNOSIS — R51 Headache: Secondary | ICD-10-CM | POA: Diagnosis not present

## 2019-01-28 MED ORDER — GADOBENATE DIMEGLUMINE 529 MG/ML IV SOLN
20.0000 mL | Freq: Once | INTRAVENOUS | Status: AC | PRN
  Administered 2019-01-28: 20 mL via INTRAVENOUS

## 2019-01-30 ENCOUNTER — Telehealth: Payer: Self-pay | Admitting: *Deleted

## 2019-01-30 NOTE — Telephone Encounter (Signed)
Spoke with patient and informed her that her MRI brain result is unremarkable. Reviewed Dr Richrd Humbles plan, recommendations per last office note.  Advised she call before FU for any questions or concerns. Patient verbalized understanding, appreciation.

## 2019-04-08 DIAGNOSIS — Z1231 Encounter for screening mammogram for malignant neoplasm of breast: Secondary | ICD-10-CM | POA: Diagnosis not present

## 2019-04-08 DIAGNOSIS — Z803 Family history of malignant neoplasm of breast: Secondary | ICD-10-CM | POA: Diagnosis not present

## 2019-04-08 DIAGNOSIS — Z01419 Encounter for gynecological examination (general) (routine) without abnormal findings: Secondary | ICD-10-CM | POA: Diagnosis not present

## 2019-04-08 DIAGNOSIS — I10 Essential (primary) hypertension: Secondary | ICD-10-CM | POA: Diagnosis not present

## 2019-04-08 DIAGNOSIS — N924 Excessive bleeding in the premenopausal period: Secondary | ICD-10-CM | POA: Diagnosis not present

## 2019-04-08 DIAGNOSIS — Z1151 Encounter for screening for human papillomavirus (HPV): Secondary | ICD-10-CM | POA: Diagnosis not present

## 2019-04-25 DIAGNOSIS — Z1231 Encounter for screening mammogram for malignant neoplasm of breast: Secondary | ICD-10-CM | POA: Diagnosis not present

## 2019-07-30 ENCOUNTER — Ambulatory Visit: Payer: BLUE CROSS/BLUE SHIELD | Admitting: Diagnostic Neuroimaging

## 2019-08-29 DIAGNOSIS — Z20828 Contact with and (suspected) exposure to other viral communicable diseases: Secondary | ICD-10-CM | POA: Diagnosis not present

## 2019-11-17 DIAGNOSIS — I10 Essential (primary) hypertension: Secondary | ICD-10-CM | POA: Diagnosis not present

## 2019-11-17 DIAGNOSIS — E039 Hypothyroidism, unspecified: Secondary | ICD-10-CM | POA: Diagnosis not present

## 2019-11-17 DIAGNOSIS — M109 Gout, unspecified: Secondary | ICD-10-CM | POA: Diagnosis not present

## 2019-11-17 DIAGNOSIS — Z0000 Encounter for general adult medical examination without abnormal findings: Secondary | ICD-10-CM | POA: Diagnosis not present

## 2019-12-12 DIAGNOSIS — I10 Essential (primary) hypertension: Secondary | ICD-10-CM | POA: Diagnosis not present

## 2019-12-12 DIAGNOSIS — Z23 Encounter for immunization: Secondary | ICD-10-CM | POA: Diagnosis not present

## 2019-12-31 DIAGNOSIS — E039 Hypothyroidism, unspecified: Secondary | ICD-10-CM | POA: Diagnosis not present

## 2020-01-06 ENCOUNTER — Other Ambulatory Visit: Payer: Self-pay

## 2020-01-06 ENCOUNTER — Encounter: Payer: Self-pay | Admitting: Diagnostic Neuroimaging

## 2020-01-06 ENCOUNTER — Ambulatory Visit: Payer: BC Managed Care – PPO | Admitting: Diagnostic Neuroimaging

## 2020-01-06 VITALS — BP 142/88 | HR 83 | Temp 97.0°F | Ht 64.0 in | Wt 240.2 lb

## 2020-01-06 DIAGNOSIS — G43009 Migraine without aura, not intractable, without status migrainosus: Secondary | ICD-10-CM | POA: Diagnosis not present

## 2020-01-06 MED ORDER — RIZATRIPTAN BENZOATE 10 MG PO TBDP: 10.0000 mg | ORAL_TABLET | ORAL | 11 refills | Status: AC | PRN

## 2020-01-06 MED ORDER — TOPIRAMATE 50 MG PO TABS: 50.0000 mg | ORAL_TABLET | Freq: Two times a day (BID) | ORAL | 12 refills | Status: AC

## 2020-01-06 NOTE — Progress Notes (Signed)
[facility]  PATIENT: Tricia Velazquez DOB: 1975/12/01  REFERRING CLINICIAN: A Morrow HISTORY FROM: patient  REASON FOR VISIT: follow up   HISTORICAL  CHIEF COMPLAINT:  Chief Complaint  Patient presents with  . Follow-up    RM 7, alone. Last seen 01/21/2019. Here to f/u on headaches. Since October, HA have worsened but not as bad as when she was first seen. She is having more migraines now. Usually last a day or so.     HISTORY OF PRESENT ILLNESS:   UPDATE (01/06/20, VRP): Since last visit, doing better with positional headaches. Now with intermittent throbbing headaches, weekly, worse with menstrual cycle. No aura. Avg 1 HA per week.   PRIOR HPI (01/20/19): 44 year old female here for evaluation of headaches.    Patient has had 2 with migraine headaches in her life, when she was younger.  These were global throbbing headaches associated with nausea and sensitive to light and sound.  She took some Imitrex which seemed to help.  Now having new headaches since November 2018 consisting of right scalp pain, right eye and face swelling, lasting up to 2 weeks at a time.  She has some throbbing sensation with this headache.  No nausea or vomiting.  No sensitive to light or sound.  Headaches can last 2 weeks at a time.  She is having headaches every 1 to 2 months.  No neck pain or dizziness.  No visual changes, aura, scotoma.  Patient has had some weight gain over the past few years.  She has had eye exams regularly without abnormalities.    REVIEW OF SYSTEMS: Full 14 system review of systems performed and negative with exception of: as per HPI.   ALLERGIES: Allergies  Allergen Reactions  . Codeine     Crazy dreams  . Gabapentin     Pt stated, "It makes me feel like a zombie; makes me tired all day"    HOME MEDICATIONS: Outpatient Medications Prior to Visit  Medication Sig Dispense Refill  . LO LOESTRIN FE 1 MG-10 MCG / 10 MCG tablet Take 1 tablet by mouth  daily.    Marland Kitchen losartan-hydrochlorothiazide (HYZAAR) 50-12.5 MG tablet Take 1 tablet by mouth daily.    Marland Kitchen SYNTHROID 150 MCG tablet TAKE 1 TABLET BY MOUTH EVERY DAY ON EMPTY STOMACH IN THE MORNING  1  . losartan (COZAAR) 50 MG tablet Take 50 mg by mouth daily.    . diclofenac (VOLTAREN) 75 MG EC tablet Take 1 tablet (75 mg total) by mouth 2 (two) times daily. 50 tablet 2  . ibuprofen (ADVIL,MOTRIN) 200 MG tablet Take 200 mg by mouth every 6 (six) hours as needed.     No facility-administered medications prior to visit.    PAST MEDICAL HISTORY: Past Medical History:  Diagnosis Date  . Fatty liver 2013  . Gout   . Headache   . Hypercholesteremia   . Hypertension   . Hypothyroidism   . Obesity   . Peripheral neuropathy     PAST SURGICAL HISTORY: Past Surgical History:  Procedure Laterality Date  . CESAREAN SECTION  1999    FAMILY HISTORY: Family History  Problem Relation Age of Onset  . Diabetes Father   . Heart attack Father   . Heart Problems Maternal Grandfather   . Lung cancer Paternal Grandmother   . Lung cancer Paternal Grandfather   . Heart Problems Paternal Grandfather     SOCIAL HISTORY: Social History   Socioeconomic History  . Marital status:  Married    Spouse name: Ronae Noell  . Number of children: 1  . Years of education: 12th  . Highest education level: Not on file  Occupational History    Employer: OTHER    Comment: [employer]  Tobacco Use  . Smoking status: Never Smoker  . Smokeless tobacco: Never Used  Substance and Sexual Activity  . Alcohol use: Yes    Comment: occasionally/ very rare  . Drug use: No  . Sexual activity: Not on file  Other Topics Concern  . Not on file  Social History Narrative   Patient lives at home with family.   Caffeine Use: 2 cups of soda daily   Social Determinants of Health   Financial Resource Strain:   . Difficulty of Paying Living Expenses: Not on file  Food Insecurity:   . Worried About  Charity fundraiser in the Last Year: Not on file  . Ran Out of Food in the Last Year: Not on file  Transportation Needs:   . Lack of Transportation (Medical): Not on file  . Lack of Transportation (Non-Medical): Not on file  Physical Activity:   . Days of Exercise per Week: Not on file  . Minutes of Exercise per Session: Not on file  Stress:   . Feeling of Stress : Not on file  Social Connections:   . Frequency of Communication with Friends and Family: Not on file  . Frequency of Social Gatherings with Friends and Family: Not on file  . Attends Religious Services: Not on file  . Active Member of Clubs or Organizations: Not on file  . Attends Archivist Meetings: Not on file  . Marital Status: Not on file  Intimate Partner Violence:   . Fear of Current or Ex-Partner: Not on file  . Emotionally Abused: Not on file  . Physically Abused: Not on file  . Sexually Abused: Not on file     PHYSICAL EXAM  GENERAL EXAM/CONSTITUTIONAL: Vitals:  Vitals:   01/06/20 0849  BP: (!) 142/88  Pulse: 83  Temp: (!) 97 F (36.1 C)  Weight: 240 lb 3.2 oz (109 kg)  Height: 5\' 4"  (1.626 m)   Body mass index is 41.23 kg/m. Wt Readings from Last 3 Encounters:  01/06/20 240 lb 3.2 oz (109 kg)  01/21/19 240 lb 12.8 oz (109.2 kg)  03/20/18 240 lb (108.9 kg)    Patient is in no distress; well developed, nourished and groomed; neck is supple  CARDIOVASCULAR:  Examination of carotid arteries is normal; no carotid bruits  Regular rate and rhythm, no murmurs  Examination of peripheral vascular system by observation and palpation is normal  EYES:  Ophthalmoscopic exam of optic discs and posterior segments is normal; no papilledema or hemorrhages No exam data present  MUSCULOSKELETAL:  Gait, strength, tone, movements noted in Neurologic exam below  NEUROLOGIC: MENTAL STATUS:  No flowsheet data found.  awake, alert, oriented to person, place and time  recent and remote  memory intact  normal attention and concentration  language fluent, comprehension intact, naming intact  fund of knowledge appropriate  CRANIAL NERVE:   2nd - no papilledema on fundoscopic exam  2nd, 3rd, 4th, 6th - pupils equal and reactive to light, visual fields full to confrontation, extraocular muscles intact, no nystagmus  5th - facial sensation symmetric  7th - facial strength symmetric  8th - hearing intact  9th - palate elevates symmetrically, uvula midline  11th - shoulder shrug symmetric  12th - tongue protrusion midline  MOTOR:   normal bulk and tone, full strength in the BUE, BLE  SENSORY:   normal and symmetric to light touch, temperature; ABSENT TEMP IN FEET; DECR VIB IN FEET  COORDINATION:   finger-nose-finger, fine finger movements normal  REFLEXES:   deep tendon reflexes --> BUE TRACE; KNEES 2; ANKLES TRACE   GAIT/STATION:   narrow based gait     DIAGNOSTIC DATA (LABS, IMAGING, TESTING) - I reviewed patient records, labs, notes, testing and imaging myself where available.  Lab Results  Component Value Date   WBC 9.7 06/03/2008   HGB 13.2 06/03/2008   HCT 38.2 06/03/2008   MCV 86.8 06/03/2008   PLT 245 06/03/2008      Component Value Date/Time   NA 139 06/03/2008 1301   K 3.5 06/03/2008 1301   CL 106 06/03/2008 1301   CO2 28 06/03/2008 1301   GLUCOSE 108 (H) 06/03/2008 1301   GLUCOSE 87 10/29/2006 1346   BUN 7 06/03/2008 1301   CREATININE 0.8 06/03/2008 1301   CALCIUM 9.0 06/03/2008 1301   PROT 7.3 03/03/2014 1126   GFRNONAA 87 06/03/2008 1301   GFRAA 106 06/03/2008 1301   No results found for: CHOL, HDL, LDLCALC, LDLDIRECT, TRIG, CHOLHDL No results found for: [id number] Lab Results  Component Value Date   VITAMINB12 553 03/03/2014   Lab Results  Component Value Date   TSH 0.251 (L) 03/03/2014   03/09/14 EMG/NCS Mildly abnormal study demonstrate: 1. Mild left peroneal motor response amplitude reduction. Normal nerve  conduction studies otherwise. Normal needle EMG. May represent mild left L5 radiculopathy versus mild left peroneal motor neuropathy. 2. No underlying widespread large fiber neuropathy.  01/28/19 MRI brain IMPRESSION: Unremarkable MRI scan of the brain with and without contrast    ASSESSMENT AND PLAN  44 y.o. year old female here with new onset headaches (Nov 2018).    Ddx: migraine variant vs idiopathic intracranial hypertension (pseudotumor cerebri)   1. Migraine without aura and without status migrainosus, not intractable      PLAN:  MIGRAINE WITHOUT AURA  - MIGRAINE PREVENTION --> start topiramate 50mg  at bedtime; after 1 week increase to twice a day; drink plenty of water - MIGRAINE RESCUE --> rizatriptan 10mg  as needed for breakthrough headache; may repeat x 1 after 2 hours; max 2 tabs per day or 8 per month - consider sleep study - continue ibuprofen or tylenol as needed  IDIOPATHIC PERIPHERAL NEUROPATHY (small fiber; biopsy proven) - monitor   Meds ordered this encounter  Medications  . topiramate (TOPAMAX) 50 MG tablet    Sig: Take 1 tablet (50 mg total) by mouth 2 (two) times daily.    Dispense:  60 tablet    Refill:  12  . rizatriptan (MAXALT-MLT) 10 MG disintegrating tablet    Sig: Take 1 tablet (10 mg total) by mouth as needed for migraine. May repeat in 2 hours if needed    Dispense:  9 tablet    Refill:  11   Return in about 6 months (around 07/05/2020) for with NP (Amy Lomax).    , MD 01/06/2020, 9:12 AM Certified in Neurology, Neurophysiology and Neuroimaging  [facility] [address], [address] [place], [address] [place] [phone]

## 2020-01-06 NOTE — Patient Instructions (Signed)
MIGRAINE WITHOUT AURA   - MIGRAINE PREVENTION --> start topiramate 50mg  at bedtime; after 1 week increase to twice a day; drink plenty of water  - MIGRAINE RESCUE --> rizatriptan 10mg  as needed for breakthrough headache; may repeat x 1 after 2 hours; max 2 tabs per day or 8 per month  - consider sleep study  - continue ibuprofen or tylenol as needed

## 2020-01-14 ENCOUNTER — Telehealth: Payer: Self-pay | Admitting: Diagnostic Neuroimaging

## 2020-01-14 DIAGNOSIS — J019 Acute sinusitis, unspecified: Secondary | ICD-10-CM | POA: Diagnosis not present

## 2020-01-14 NOTE — Telephone Encounter (Signed)
Agree with plan. --VRP 

## 2020-01-14 NOTE — Telephone Encounter (Signed)
Pt is asking for a call to discuss her having a possible allergic reaction to topiramate (TOPAMAX) 50 MG tablet.  Pt is having diarrhea, sneezing, stopped up nose, and tickling in her throat.  Please call

## 2020-01-14 NOTE — Telephone Encounter (Signed)
Spoke with patient; she started topamax on 01/08/20, taking one daily. On Fri she developed fatigue, and the next day fatigue, headache, no appetite, diarrhea. On Mon she developed tickle in throat, runny/stuffy nose, feeling like she needs to sneeze. She denied fever, loss of taste and smell; stated scents are heightened.   She stated the fatigue is similar to when she was on gabapentin. She last took Topamax Mon night. Today she has same symptoms, maybe a little less fatigue. I advised she stop topamax, see if symptoms resolve. If not, she needs to call PCP for further evaluation. I advised will let MD know and call her if any further instructions. Patient verbalized understanding, appreciation.

## 2020-01-14 NOTE — Telephone Encounter (Signed)
Called patient and advised Dr Marjory Lies agrees with plan. Patient verbalized understanding, appreciation.

## 2020-01-27 DIAGNOSIS — E039 Hypothyroidism, unspecified: Secondary | ICD-10-CM | POA: Diagnosis not present

## 2020-01-27 DIAGNOSIS — R0981 Nasal congestion: Secondary | ICD-10-CM | POA: Diagnosis not present

## 2020-01-27 DIAGNOSIS — I10 Essential (primary) hypertension: Secondary | ICD-10-CM | POA: Diagnosis not present

## 2020-01-27 DIAGNOSIS — R519 Headache, unspecified: Secondary | ICD-10-CM | POA: Diagnosis not present

## 2020-02-03 DIAGNOSIS — J342 Deviated nasal septum: Secondary | ICD-10-CM | POA: Diagnosis not present

## 2020-02-03 DIAGNOSIS — R0981 Nasal congestion: Secondary | ICD-10-CM | POA: Diagnosis not present

## 2020-02-03 DIAGNOSIS — J3489 Other specified disorders of nose and nasal sinuses: Secondary | ICD-10-CM | POA: Diagnosis not present

## 2020-03-25 DIAGNOSIS — J342 Deviated nasal septum: Secondary | ICD-10-CM | POA: Diagnosis not present

## 2020-03-25 DIAGNOSIS — J3489 Other specified disorders of nose and nasal sinuses: Secondary | ICD-10-CM | POA: Diagnosis not present

## 2020-03-25 DIAGNOSIS — J343 Hypertrophy of nasal turbinates: Secondary | ICD-10-CM | POA: Diagnosis not present

## 2020-03-25 DIAGNOSIS — M950 Acquired deformity of nose: Secondary | ICD-10-CM | POA: Diagnosis not present

## 2020-03-25 NOTE — Progress Notes (Signed)
 [facility] Facial Plastic Surgery  HISTORY: Tricia Velazquez is a 44 y.o. female who presents to the [facility]astic [facility] in consultation with Dr. 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  History is significant for, per Dr. Normie note:  Tricia Velazquez is a 44 y.o. female seen in consultation at the request of  Kip Beverley Camp,* for evaluation of sinus problems.  She reports chronic nasal congestion, sinus headaches, ear pressure. It is always difficult to breathe through the left side of her nose. Has tried mucinex, sudafed, antihistamine. Has tried Flonase. States she will not do a sinus rinse because it goes to my ears. Had sinus infection last month, treated with Augmentin  x 10 days, symptoms improved temporarily. Improved breathing.     Sense of smell is normal. + facial pressure over forehead and over cheeks.  Has tried Breathe Right strips and flonase. Strips helped at night, but obviously persistent congestion in the nose during the daytime hours persist. Has nasal fracture on trampoline at age 52.     History was independently confirmed by me.    In addition, symptoms have worsened over the course of the past year.  She has mouth breathing at night and during the day while she is working.  Positive history of snoring, no history of sleep apnea.  No history of anesthetic complications.  No history of personal or family bleeding/clotting disorders.   NOSE scale score 70/100.  Notable past medical history: as above   REVIEW OF SYSTEMS: As per HPI.  Otherwise negative 14-point review of systems as per the scanned patient information sheet.  There is no problem list on file for this patient.   Past Medical History:  Diagnosis Date   Glaucoma    Hypertension    Migraine    Nasal obstruction    Nerve damage    Thyroid  disease      Past Surgical History:  Procedure Laterality Date   CESAREAN SECTION      Family History: No family history of wound healing problems, bleeding disorders, or reactions to anesthesia.  Allergies: Allergies  Allergen Reactions   Gabapentin  Other (See Comments)    Pt stated, It makes me feel like a zombie; makes me tired all day    Codeine Mental Status Changes (intolerance)    Crazy dreams    Medications:  Current Outpatient Medications:    diclofenac  (VOLTAREN ) 75 MG EC tablet, Take 75 mg by mouth., Disp: , Rfl:    fluticasone propionate (FLONASE) 50 mcg/actuation nasal spray, 1 spray by Each Nare route daily., Disp: , Rfl:    LO LOESTRIN FE 1 mg-10 mcg (24)/10 mcg (2) tablet, Take 1 tablet by mouth daily., Disp: 84 tablet, Rfl: 3   losartan-hydrochlorothiazide (HYZAAR) 50-12.5 mg per tablet, Take by mouth., Disp: , Rfl:    rizatriptan  (MAXALT -MLT) 10 MG disintegrating tablet, Take 10 mg by mouth 2 (two) times daily as needed., Disp: , Rfl:    SYNTHROID 150 mcg tablet,  , Disp: , Rfl:    topiramate  (TOPAMAX ) 50 MG tablet, Take 50 mg by mouth 2 (two) times daily as needed., Disp: , Rfl:   Social History:   Social History   Socioeconomic History   Marital status: Married    Spouse name: Not on file   Number of children: Not on file   Years of education: Not on file   Highest  education level: Not on file  Occupational History   Not on file  Tobacco Use   Smoking status: Never Smoker   Smokeless tobacco: Never Used  Substance and Sexual Activity   Alcohol use: Yes   Drug use: Not Currently   Sexual activity: Yes    Partners: Male    Birth control/protection: Condom  Other Topics Concern   Not on file  Social History Narrative   Not on file   Social Determinants of Health   Financial Resource Strain:    Difficulty of Paying Living Expenses:   Food Insecurity:    Worried About Programme Researcher, Broadcasting/film/video in the Last Year:    Engineer, Site in the Last Year:   Transportation Needs:    Freight Forwarder (Medical):    Lack of Transportation (Non-Medical):   Physical Activity:    Days of Exercise per Week:    Minutes of Exercise per Session:   Stress:    Feeling of Stress :   Social Connections:    Frequency of Communication with Friends and Family:    Frequency of Social Gatherings with Friends and Family:    Attends Religious Services:    Active Member of Clubs or Organizations:    Attends Banker Meetings:    Marital Status:      Blood pressure 169/73, pulse 86, temperature 97.8 F (36.6 C), temperature source Temporal, height 1.626 m (5' 4), weight 108.9 kg (240 lb), SpO2 98 %.  PHYSICAL EXAM:  General Appearance: Pleasant, well-developed, well-nourished patient in no apparent distress. Mental status is normal.  Head and Face: No skin lesions, face with symmetric motion bilaterally, sensation normal bilaterally.  Bony orbits intact, midface stable.  Mandible/TMJ - normal.  Eyes: Extraocular muscles intact.  Pupils equal, round and reactive to light.     Ears: External ears normal to inspection and palpation.  No ear scars.  Hearing thresholds grossly within normal limits.  Conchal bowls normal.  Nose:  Nasal skin:  Relatively thick Nasal septum: deviated left caudally, twisting the tip to that side.  Septum oriented at a 45 degree position such that there is a right midseptal deviation Nasal valves:  Collapsed esp left, palpably weak ULCs bilat Modified Cottle maneuver:  Positive, esp left Dorsum:  Deviated left, progressively worse from cranial to caudal Tip:  Asymmetric domes, right dome sits lower than left Tip support:  modest Inferior turbinates:  Hypertrophied left (compensatory) Nasal mucosa by anterior rhinoscopy:  Slightly hyperemic  Oral Cavity/Oropharynx: No masses or lesions of lips, gums, tongue, floor of mouth, buccal mucosa, hard palate or soft palate.   Dentition - good repair.  No erythema, exudate, or tonsillar masses.  Posterior pharyngeal wall is normal.    Airway:  No trismus.    Neck/Lymphatic: Soft and supple without adenopathy.  Thyroid  is midline without fullness or nodules.  Salivary glands normal.  Neck ROM is age-appropriate.  Neurologic: Cranial nerves II-XII grossly intact.  Gait - normal.  Respiratory:  Breathing quietly, comfortably, no stridor or wheezing.  Cardiovascular:  No cyanosis.   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  PLAN: We have discussed issues and options today. I reviewed the relevant anatomy. She has numerous interrelated structural deformities which, taken together, are contributing to symptoms. In this case, the caudal septum acts as the load-bearing wall for the entire lower nose.  The nasal dorsal deviation is intimately associated with the caudal septal deformity, and  so correction of the septum is not possible unless we also address the dorsum.   Here, we must redefine the relationships between the septum and upper lateral cartilages at the dorsum and the septum and lower lateral cartilages at the nasal tip.  Each individual anatomic structure literally affects the other structures adjacent to it.   This requires a complex and comprehensive surgical approach - thus the need for a rhinoplasty as part of the recommended treatment. Standard endonasal septoplasty techniques will not be adequate for this problem.  The risks, benefits and alternatives of open functional septorhinoplasty with possible ear cartilage graft were discussed and questions answered. She has elected to proceed with preauthorization and photos were taken. I think that she would best benefit from an open rhinoplasty approach with extracorporeal septoplasty, possibly reconstructed on a PDS plate. She would also benefit from butterfly vs. spreader grafting to widen the middle vault and provide  an increase in cross-sectional diameter of the nasal airway. This sequence of maneuvers cannot be done without doing an open rhinoplasty.  To comprehensively reconstruct and optimize the nasal airway, the following may be needed:  Ear cartilage graft:  possible  Donor rib cartilage graft:  no  Inferior turbinate reduction:  yes  We discussed that this is a hard anatomic deformity for which medications will not provide relief and for which surgical intervention is the only reasonable approach. She is aware that septoplasty alone will not provide a comprehensive solution for this problem. Risks, benefits and alternatives were discussed including risk of bleeding, infection, scar, failure to improve, need for additional procedures, persistent nasal obstruction, possible donor site scar on the ear. She is also aware of the transcolumellar scar, the postoperative care including splint(s), the use of nasal saline, and the need to take 1-2 weeks off from work/activities/sports postoperatively.   The NOSE scale is a validated instrument for quantifying nasal obstruction. This patient's score of 70 suggests severe symptoms. In my experience, we have consistently been able to drive this score down into the 20's or 30's postoperatively (indicating markedly improved symptoms). I think she is an excellent candidate for this operation.  It should be noted that the lower two thirds of the nose is structurally composed of cartilage and soft tissue.  It is this very area of the nose - dictated by cartilage and soft tissue - that is the biggest determinant of nasal airway patency.  Deformities of the lower two thirds of the nose (cartilage and soft tissue) create the majority of nasal obstruction cases that we see in clinical practice.  Therefore, I strongly feel that this patient's nasal airway reconstruction as proposed above is medically necessary and would significantly improve the nasal obstructive  symptoms.   Redell MICAEL Dural, MD, FACS Associate Professor Medical Director of Facial Plastic Surgery Facial Plastic and Reconstructive Surgery Director of Outreach Department of Otolaryngology [facility]  Electronically Signed by: Redell LELON Dural, MD, Attending Physician 03/25/2020 9:11 AM       Electronically signed by: Redell LELON Dural, MD 03/25/20 1226

## 2020-07-05 ENCOUNTER — Ambulatory Visit: Payer: BC Managed Care – PPO | Admitting: Family Medicine

## 2020-08-19 DIAGNOSIS — H6500 Acute serous otitis media, unspecified ear: Secondary | ICD-10-CM | POA: Diagnosis not present

## 2020-08-19 DIAGNOSIS — Z20822 Contact with and (suspected) exposure to covid-19: Secondary | ICD-10-CM | POA: Diagnosis not present

## 2020-08-19 DIAGNOSIS — R519 Headache, unspecified: Secondary | ICD-10-CM | POA: Diagnosis not present

## 2020-12-10 DIAGNOSIS — G629 Polyneuropathy, unspecified: Secondary | ICD-10-CM | POA: Diagnosis not present

## 2020-12-10 DIAGNOSIS — Z13220 Encounter for screening for lipoid disorders: Secondary | ICD-10-CM | POA: Diagnosis not present

## 2020-12-10 DIAGNOSIS — I10 Essential (primary) hypertension: Secondary | ICD-10-CM | POA: Diagnosis not present

## 2020-12-10 DIAGNOSIS — E039 Hypothyroidism, unspecified: Secondary | ICD-10-CM | POA: Diagnosis not present

## 2020-12-10 DIAGNOSIS — Z0000 Encounter for general adult medical examination without abnormal findings: Secondary | ICD-10-CM | POA: Diagnosis not present

## 2021-01-31 ENCOUNTER — Telehealth: Payer: Self-pay | Admitting: Podiatry

## 2021-01-31 NOTE — Telephone Encounter (Signed)
Patient is requesting refill on lyrica

## 2021-02-02 ENCOUNTER — Other Ambulatory Visit: Payer: Self-pay | Admitting: Podiatry

## 2021-02-02 DIAGNOSIS — E039 Hypothyroidism, unspecified: Secondary | ICD-10-CM | POA: Diagnosis not present

## 2021-02-02 DIAGNOSIS — I498 Other specified cardiac arrhythmias: Secondary | ICD-10-CM | POA: Diagnosis not present

## 2021-02-02 MED ORDER — PREGABALIN 75 MG PO CAPS: 75.0000 mg | ORAL_CAPSULE | Freq: Two times a day (BID) | ORAL | 3 refills | Status: AC

## 2021-12-26 DIAGNOSIS — I10 Essential (primary) hypertension: Secondary | ICD-10-CM | POA: Diagnosis not present

## 2021-12-26 DIAGNOSIS — G629 Polyneuropathy, unspecified: Secondary | ICD-10-CM | POA: Diagnosis not present

## 2021-12-26 DIAGNOSIS — Z0000 Encounter for general adult medical examination without abnormal findings: Secondary | ICD-10-CM | POA: Diagnosis not present

## 2021-12-26 DIAGNOSIS — E039 Hypothyroidism, unspecified: Secondary | ICD-10-CM | POA: Diagnosis not present

## 2021-12-26 DIAGNOSIS — Z13220 Encounter for screening for lipoid disorders: Secondary | ICD-10-CM | POA: Diagnosis not present

## 2022-08-29 DIAGNOSIS — H25043 Posterior subcapsular polar age-related cataract, bilateral: Secondary | ICD-10-CM | POA: Diagnosis not present

## 2022-08-29 DIAGNOSIS — H2513 Age-related nuclear cataract, bilateral: Secondary | ICD-10-CM | POA: Diagnosis not present

## 2022-08-29 DIAGNOSIS — H2511 Age-related nuclear cataract, right eye: Secondary | ICD-10-CM | POA: Diagnosis not present

## 2022-08-29 DIAGNOSIS — H25013 Cortical age-related cataract, bilateral: Secondary | ICD-10-CM | POA: Diagnosis not present

## 2022-08-29 DIAGNOSIS — H18413 Arcus senilis, bilateral: Secondary | ICD-10-CM | POA: Diagnosis not present

## 2022-11-03 DIAGNOSIS — H25811 Combined forms of age-related cataract, right eye: Secondary | ICD-10-CM | POA: Diagnosis not present

## 2022-11-03 DIAGNOSIS — H2512 Age-related nuclear cataract, left eye: Secondary | ICD-10-CM | POA: Diagnosis not present

## 2022-11-03 DIAGNOSIS — H2511 Age-related nuclear cataract, right eye: Secondary | ICD-10-CM | POA: Diagnosis not present

## 2022-11-09 DIAGNOSIS — H25811 Combined forms of age-related cataract, right eye: Secondary | ICD-10-CM | POA: Diagnosis not present

## 2022-11-17 DIAGNOSIS — H2512 Age-related nuclear cataract, left eye: Secondary | ICD-10-CM | POA: Diagnosis not present

## 2023-01-05 DIAGNOSIS — E039 Hypothyroidism, unspecified: Secondary | ICD-10-CM | POA: Diagnosis not present

## 2023-01-05 DIAGNOSIS — I10 Essential (primary) hypertension: Secondary | ICD-10-CM | POA: Diagnosis not present

## 2023-01-05 DIAGNOSIS — M25539 Pain in unspecified wrist: Secondary | ICD-10-CM | POA: Diagnosis not present

## 2023-01-15 DIAGNOSIS — B9689 Other specified bacterial agents as the cause of diseases classified elsewhere: Secondary | ICD-10-CM | POA: Diagnosis not present

## 2023-01-15 DIAGNOSIS — J208 Acute bronchitis due to other specified organisms: Secondary | ICD-10-CM | POA: Diagnosis not present

## 2023-01-15 DIAGNOSIS — R059 Cough, unspecified: Secondary | ICD-10-CM | POA: Diagnosis not present

## 2023-01-19 DIAGNOSIS — E039 Hypothyroidism, unspecified: Secondary | ICD-10-CM | POA: Diagnosis not present

## 2023-02-28 DIAGNOSIS — E876 Hypokalemia: Secondary | ICD-10-CM | POA: Diagnosis not present

## 2023-06-20 DIAGNOSIS — S90822A Blister (nonthermal), left foot, initial encounter: Secondary | ICD-10-CM | POA: Diagnosis not present

## 2023-06-20 DIAGNOSIS — S90821A Blister (nonthermal), right foot, initial encounter: Secondary | ICD-10-CM | POA: Diagnosis not present

## 2023-07-04 DIAGNOSIS — Z0000 Encounter for general adult medical examination without abnormal findings: Secondary | ICD-10-CM | POA: Diagnosis not present

## 2023-07-04 DIAGNOSIS — R5383 Other fatigue: Secondary | ICD-10-CM | POA: Diagnosis not present

## 2023-07-04 DIAGNOSIS — I10 Essential (primary) hypertension: Secondary | ICD-10-CM | POA: Diagnosis not present

## 2023-07-04 DIAGNOSIS — E039 Hypothyroidism, unspecified: Secondary | ICD-10-CM | POA: Diagnosis not present

## 2023-08-01 DIAGNOSIS — Z1331 Encounter for screening for depression: Secondary | ICD-10-CM | POA: Diagnosis not present

## 2023-08-01 DIAGNOSIS — E8889 Other specified metabolic disorders: Secondary | ICD-10-CM | POA: Diagnosis not present

## 2023-08-01 DIAGNOSIS — I10 Essential (primary) hypertension: Secondary | ICD-10-CM | POA: Diagnosis not present

## 2023-08-01 DIAGNOSIS — E039 Hypothyroidism, unspecified: Secondary | ICD-10-CM | POA: Diagnosis not present

## 2023-08-15 DIAGNOSIS — I10 Essential (primary) hypertension: Secondary | ICD-10-CM | POA: Diagnosis not present

## 2023-08-15 DIAGNOSIS — E039 Hypothyroidism, unspecified: Secondary | ICD-10-CM | POA: Diagnosis not present

## 2023-08-29 DIAGNOSIS — I10 Essential (primary) hypertension: Secondary | ICD-10-CM | POA: Diagnosis not present

## 2023-08-29 DIAGNOSIS — E039 Hypothyroidism, unspecified: Secondary | ICD-10-CM | POA: Diagnosis not present

## 2023-09-19 DIAGNOSIS — E66812 Obesity, class 2: Secondary | ICD-10-CM | POA: Diagnosis not present

## 2023-09-19 DIAGNOSIS — E039 Hypothyroidism, unspecified: Secondary | ICD-10-CM | POA: Diagnosis not present

## 2023-09-19 DIAGNOSIS — I10 Essential (primary) hypertension: Secondary | ICD-10-CM | POA: Diagnosis not present

## 2023-10-10 DIAGNOSIS — I10 Essential (primary) hypertension: Secondary | ICD-10-CM | POA: Diagnosis not present

## 2023-10-10 DIAGNOSIS — E039 Hypothyroidism, unspecified: Secondary | ICD-10-CM | POA: Diagnosis not present

## 2023-10-29 DIAGNOSIS — Z1211 Encounter for screening for malignant neoplasm of colon: Secondary | ICD-10-CM | POA: Diagnosis not present

## 2023-10-29 DIAGNOSIS — K5730 Diverticulosis of large intestine without perforation or abscess without bleeding: Secondary | ICD-10-CM | POA: Diagnosis not present

## 2023-11-01 DIAGNOSIS — Z1211 Encounter for screening for malignant neoplasm of colon: Secondary | ICD-10-CM | POA: Diagnosis not present

## 2023-11-28 DIAGNOSIS — I10 Essential (primary) hypertension: Secondary | ICD-10-CM | POA: Diagnosis not present

## 2023-11-28 DIAGNOSIS — E039 Hypothyroidism, unspecified: Secondary | ICD-10-CM | POA: Diagnosis not present

## 2023-12-27 DIAGNOSIS — E039 Hypothyroidism, unspecified: Secondary | ICD-10-CM | POA: Diagnosis not present

## 2023-12-27 DIAGNOSIS — I10 Essential (primary) hypertension: Secondary | ICD-10-CM | POA: Diagnosis not present

## 2024-01-30 DIAGNOSIS — I10 Essential (primary) hypertension: Secondary | ICD-10-CM | POA: Diagnosis not present

## 2024-01-30 DIAGNOSIS — E039 Hypothyroidism, unspecified: Secondary | ICD-10-CM | POA: Diagnosis not present

## 2024-01-30 DIAGNOSIS — E66812 Obesity, class 2: Secondary | ICD-10-CM | POA: Diagnosis not present

## 2024-03-12 DIAGNOSIS — E039 Hypothyroidism, unspecified: Secondary | ICD-10-CM | POA: Diagnosis not present

## 2024-03-12 DIAGNOSIS — I10 Essential (primary) hypertension: Secondary | ICD-10-CM | POA: Diagnosis not present

## 2024-04-16 DIAGNOSIS — E039 Hypothyroidism, unspecified: Secondary | ICD-10-CM | POA: Diagnosis not present

## 2024-04-16 DIAGNOSIS — E669 Obesity, unspecified: Secondary | ICD-10-CM | POA: Diagnosis not present

## 2024-04-16 DIAGNOSIS — I10 Essential (primary) hypertension: Secondary | ICD-10-CM | POA: Diagnosis not present

## 2024-05-14 DIAGNOSIS — E669 Obesity, unspecified: Secondary | ICD-10-CM | POA: Diagnosis not present

## 2024-05-14 DIAGNOSIS — I10 Essential (primary) hypertension: Secondary | ICD-10-CM | POA: Diagnosis not present

## 2024-05-14 DIAGNOSIS — E039 Hypothyroidism, unspecified: Secondary | ICD-10-CM | POA: Diagnosis not present

## 2024-06-11 DIAGNOSIS — Z6834 Body mass index (BMI) 34.0-34.9, adult: Secondary | ICD-10-CM | POA: Diagnosis not present

## 2024-06-11 DIAGNOSIS — I10 Essential (primary) hypertension: Secondary | ICD-10-CM | POA: Diagnosis not present

## 2024-06-11 DIAGNOSIS — Z79899 Other long term (current) drug therapy: Secondary | ICD-10-CM | POA: Diagnosis not present

## 2024-06-11 DIAGNOSIS — E039 Hypothyroidism, unspecified: Secondary | ICD-10-CM | POA: Diagnosis not present

## 2024-07-10 DIAGNOSIS — I10 Essential (primary) hypertension: Secondary | ICD-10-CM | POA: Diagnosis not present

## 2024-07-10 DIAGNOSIS — Z23 Encounter for immunization: Secondary | ICD-10-CM | POA: Diagnosis not present

## 2024-07-10 DIAGNOSIS — Z0000 Encounter for general adult medical examination without abnormal findings: Secondary | ICD-10-CM | POA: Diagnosis not present

## 2024-07-10 DIAGNOSIS — E039 Hypothyroidism, unspecified: Secondary | ICD-10-CM | POA: Diagnosis not present

## 2024-07-14 DIAGNOSIS — E039 Hypothyroidism, unspecified: Secondary | ICD-10-CM | POA: Diagnosis not present

## 2024-07-14 DIAGNOSIS — I10 Essential (primary) hypertension: Secondary | ICD-10-CM | POA: Diagnosis not present

## 2024-07-14 DIAGNOSIS — E669 Obesity, unspecified: Secondary | ICD-10-CM | POA: Diagnosis not present

## 2024-08-18 DIAGNOSIS — E039 Hypothyroidism, unspecified: Secondary | ICD-10-CM | POA: Diagnosis not present

## 2024-08-18 DIAGNOSIS — I10 Essential (primary) hypertension: Secondary | ICD-10-CM | POA: Diagnosis not present

## 2024-08-18 DIAGNOSIS — E669 Obesity, unspecified: Secondary | ICD-10-CM | POA: Diagnosis not present

## 2024-09-03 DIAGNOSIS — E039 Hypothyroidism, unspecified: Secondary | ICD-10-CM | POA: Diagnosis not present

## 2024-10-01 DIAGNOSIS — I10 Essential (primary) hypertension: Secondary | ICD-10-CM | POA: Diagnosis not present

## 2024-10-01 DIAGNOSIS — E669 Obesity, unspecified: Secondary | ICD-10-CM | POA: Diagnosis not present

## 2024-10-01 DIAGNOSIS — E039 Hypothyroidism, unspecified: Secondary | ICD-10-CM | POA: Diagnosis not present

## 2024-12-22 ENCOUNTER — Encounter (HOSPITAL_BASED_OUTPATIENT_CLINIC_OR_DEPARTMENT_OTHER): Payer: Self-pay

## 2024-12-22 ENCOUNTER — Other Ambulatory Visit: Payer: Self-pay

## 2024-12-22 ENCOUNTER — Emergency Department (HOSPITAL_BASED_OUTPATIENT_CLINIC_OR_DEPARTMENT_OTHER)
Admission: EM | Admit: 2024-12-22 | Discharge: 2024-12-22 | Disposition: A | Discharge status: Home / Self Care | Attending: Emergency Medicine | Admitting: Emergency Medicine

## 2024-12-22 DIAGNOSIS — E039 Hypothyroidism, unspecified: Secondary | ICD-10-CM | POA: Insufficient documentation

## 2024-12-22 DIAGNOSIS — H9212 Otorrhea, left ear: Secondary | ICD-10-CM | POA: Insufficient documentation

## 2024-12-22 DIAGNOSIS — H9202 Otalgia, left ear: Secondary | ICD-10-CM

## 2024-12-22 DIAGNOSIS — H6690 Otitis media, unspecified, unspecified ear: Secondary | ICD-10-CM

## 2024-12-22 DIAGNOSIS — H6692 Otitis media, unspecified, left ear: Secondary | ICD-10-CM | POA: Insufficient documentation

## 2024-12-22 DIAGNOSIS — I10 Essential (primary) hypertension: Secondary | ICD-10-CM | POA: Insufficient documentation

## 2024-12-22 MED ORDER — AMOXICILLIN-POT CLAVULANATE 875-125 MG PO TABS
1.0000 | ORAL_TABLET | Freq: Once | ORAL | Status: AC
  Administered 2024-12-22: 1 via ORAL
  Filled 2024-12-22: qty 1

## 2024-12-22 MED ORDER — AMOXICILLIN-POT CLAVULANATE 875-125 MG PO TABS: 1.0000 | ORAL_TABLET | Freq: Two times a day (BID) | ORAL | 0 refills | Status: AC

## 2024-12-22 NOTE — ED Provider Notes (Signed)
 " [place] [facility] Provider Note   CSN: [id number] Arrival date & time: 12/22/24  2134     Patient presents with: Otalgia   Tricia Velazquez is a 49 y.o. female.   The history is provided by the patient and medical records. No language interpreter was used.  Otalgia Location:  Left Behind ear:  No abnormality Quality:  Aching Severity:  Moderate Onset quality:  Gradual Duration:  3 days Timing:  Constant Progression:  Worsening Chronicity:  New Context: recent URI   Relieved by:  Nothing Worsened by:  Nothing Ineffective treatments:  None tried Associated symptoms: congestion, cough, ear discharge, rhinorrhea and sore throat   Associated symptoms: no abdominal pain, no diarrhea, no fever, no headaches, no neck pain, no rash and no vomiting        Prior to Admission medications  Medication Sig Start Date End Date Taking? Authorizing Provider  LO LOESTRIN FE 1 MG-10 MCG / 10 MCG tablet Take 1 tablet by mouth daily. 11/13/19   [provider]  losartan-hydrochlorothiazide (HYZAAR) 50-12.5 MG tablet Take 1 tablet by mouth daily. 09/19/15   [provider]  pregabalin  (LYRICA ) 75 MG capsule Take 1 capsule (75 mg total) by mouth 2 (two) times daily. 02/02/21   Magdalen Pasco GORMAN, DPM  rizatriptan  (MAXALT -MLT) 10 MG disintegrating tablet Take 1 tablet (10 mg total) by mouth as needed for migraine. May repeat in 2 hours if needed 01/06/20   Penumalli, Vikram R, MD  SYNTHROID 150 MCG tablet TAKE 1 TABLET BY MOUTH EVERY DAY ON EMPTY STOMACH IN THE MORNING 03/03/18   [provider]  topiramate  (TOPAMAX ) 50 MG tablet Take 1 tablet (50 mg total) by mouth 2 (two) times daily. 01/06/20   Penumalli, Eduard SAUNDERS, MD    Allergies: Codeine and Gabapentin     Review of Systems  Constitutional:  Positive for chills. Negative for fatigue and fever.  HENT:  Positive for congestion, ear discharge, ear pain, rhinorrhea and sore throat.  Negative for trouble swallowing.   Eyes:  Negative for visual disturbance.  Respiratory:  Positive for cough. Negative for chest tightness, shortness of breath, wheezing and stridor.   Cardiovascular:  Negative for chest pain and palpitations.  Gastrointestinal:  Negative for abdominal pain, constipation, diarrhea, nausea and vomiting.  Genitourinary:  Negative for dysuria and flank pain.  Musculoskeletal:  Negative for back pain, neck pain and neck stiffness.  Skin:  Negative for rash and wound.  Neurological:  Negative for weakness, light-headedness, numbness and headaches.  Psychiatric/Behavioral:  Negative for agitation and confusion.   All other systems reviewed and are negative.   Updated Vital Signs BP (!) 147/78 (BP Location: Right Arm)   Pulse 90   Temp 98.3 F (36.8 C) (Oral)   Resp 18   Ht 5' 4 (1.626 m)   Wt 91.6 kg   LMP 11/20/2024 (Exact Date)   SpO2 100%   BMI 34.67 kg/m   Physical Exam Vitals and nursing note reviewed.  Constitutional:      General: She is not in acute distress.    Appearance: She is well-developed. She is not ill-appearing, toxic-appearing or diaphoretic.  HENT:     Head: Atraumatic.     Left Ear: Drainage and swelling present. No PE tube. Tympanic membrane is perforated and erythematous.     Ears:     Comments: Patient has some bleeding and drainage from the left ear canal with a red erythematous TM with a  very small appearing perforation.  No clear evidence of otitis externa and there was no evidence of mastoiditis or tenderness behind the ear.  She did have palpable lymph nodes in her throat but no evidence of PTA or RPA on oropharyngeal exam.  Some drainage and erythema seen.  Patient did not want a strep swab and she already test herself for COVID and flu at home.     Nose: Congestion and rhinorrhea present.     Mouth/Throat:     Pharynx: Posterior oropharyngeal erythema present. No oropharyngeal exudate.  Eyes:     Extraocular  Movements: Extraocular movements intact.     Conjunctiva/sclera: Conjunctivae normal.     Pupils: Pupils are equal, round, and reactive to light.  Neck:     Vascular: No carotid bruit.  Cardiovascular:     Rate and Rhythm: Normal rate and regular rhythm.     Heart sounds: No murmur heard. Pulmonary:     Effort: Pulmonary effort is normal. No respiratory distress.     Breath sounds: Normal breath sounds. No wheezing, rhonchi or rales.  Chest:     Chest wall: No tenderness.  Abdominal:     General: Abdomen is flat.     Palpations: Abdomen is soft.     Tenderness: There is no abdominal tenderness. There is no guarding or rebound.  Musculoskeletal:        General: No swelling or tenderness.     Cervical back: Neck supple. No rigidity or tenderness.  Lymphadenopathy:     Cervical: Cervical adenopathy present.  Skin:    General: Skin is warm and dry.     Capillary Refill: Capillary refill takes less than 2 seconds.     Findings: No erythema or rash.  Neurological:     General: No focal deficit present.     Mental Status: She is alert.     Sensory: No sensory deficit.     Motor: No weakness.  Psychiatric:        Mood and Affect: Mood normal.     (all labs ordered are listed, but only abnormal results are displayed) Labs Reviewed - No data to display  EKG: None  Radiology: No results found.   Procedures   Medications Ordered in the ED  amoxicillin -clavulanate (AUGMENTIN ) 875-125 MG per tablet 1 tablet (1 tablet Oral Given 12/22/24 2232)                                    Medical Decision Making   Tricia Velazquez is a 49 y.o. female with a past medical history significant for gout, hypothyroidism, hypertension, chronic peripheral neuropathy, and fatty liver who presents with URI symptoms ear pain and ear bleeding/drainage.  According to patient, for the last 2 days she had URI symptoms with congestion and cough sore throat and cold.  She reports she took a COVID  negative but she was having more ear pain today.  When she bent over to get something and she felt a pop in her left ear and had onset of drainage and bleeding from the left ear.  She reports it is painful but can still hear.  She otherwise is denying nausea, vomiting, constipation, diarrhea, or urinary changes.  Denies chest pain or shortness of breath.  On exam, lungs clear and chest nontender.  Abdomen nontender.  She has lymphadenopathy in her throat bilaterally but no evidence of PTA or RPA on  peripheral pharyngeal exam.  Uvula is midline.  There is some erythema but no tonsillar exudates.  No mastoid tenderness on exam and there is no external erythema however her left ear did reveal erythematous and angry TM with some bleeding drainage and what appears to a very small perforation.   We had a shared decision-making conversation and agreed to hold on strep swab or other workup at this time but we will give prescription for antibiotics.  Patient also has a history with sinus infections and ENT follow-up with [facility] so she will call them.  Will give a dose of antibiotics here and prescription for antibiotics and she will follow-up with outpatient team.  She understood return precautions and follow-up instructions and was discharged in stable condition.               Final diagnoses:  Acute otitis media, unspecified otitis media type  Otalgia of left ear  Ear discharges/bleeding, left    ED Discharge Orders          Ordered    amoxicillin -clavulanate (AUGMENTIN ) 875-125 MG tablet  Every 12 hours        12/22/24 2232            Clinical Impression: 1. Acute otitis media, unspecified otitis media type   2. Otalgia of left ear   3. Ear discharges/bleeding, left     Disposition: Discharge  Condition: Good  I have discussed the results, Dx and Tx plan with the pt(& family if present). He/she/they expressed understanding and agree(s) with the plan. Discharge instructions  discussed at great length. Strict return precautions discussed and pt &/or family have verbalized understanding of the instructions. No further questions at time of discharge.    Discharge Medication List as of 12/22/2024 10:35 PM     START taking these medications   Details  amoxicillin -clavulanate (AUGMENTIN ) 875-125 MG tablet Take 1 tablet by mouth every 12 (twelve) hours for 10 days., Starting Mon 12/22/2024, Until Thu 01/01/2025, Print        Follow Up: Kip Righter, MD [address] [place] [address] [phone]     your prior ENT team         Chesni Vos, Lonni PARAS, MD 12/22/24 2303  "

## 2024-12-22 NOTE — Discharge Instructions (Signed)
 Your history, exam, and evaluation today seem consistent with otitis media or ear infection that led to perforation with bleeding and discharge from the left ear.  You also have upper respiratory symptoms with the congestion, cough and sore throat however you tested negative for COVID/flu at home so we agreed to hold on testing it again.  The rest of your exam was reassuring and you did have lymph nodes in your neck likely responding to the infection.  We agreed to hold on more extensive lab or imaging workup today but the evaluation did show a left ear infection with a very small appearing perforation to the eardrum.  Due to your symptoms we agreed with strong oral antibiotics and follow-up with your ear nose and throat team which you have seen in the past.  Please call them to schedule close follow-up.  If any symptoms change or worsen acutely, please return to the nearest emergency department for further workup and management.
